# Patient Record
Sex: Male | Born: 1935 | Race: White | Hispanic: No | Marital: Married | Smoking: Former smoker
Health system: Southern US, Community
[De-identification: ages and names within clinical notes are randomized; demographics above are authoritative.]

## PROBLEM LIST (undated history)

## (undated) DIAGNOSIS — I10 Essential (primary) hypertension: Secondary | ICD-10-CM

## (undated) DIAGNOSIS — I219 Acute myocardial infarction, unspecified: Secondary | ICD-10-CM

## (undated) DIAGNOSIS — G4733 Obstructive sleep apnea (adult) (pediatric): Secondary | ICD-10-CM

## (undated) DIAGNOSIS — E669 Obesity, unspecified: Secondary | ICD-10-CM

## (undated) DIAGNOSIS — E785 Hyperlipidemia, unspecified: Secondary | ICD-10-CM

## (undated) DIAGNOSIS — R05 Cough: Secondary | ICD-10-CM

## (undated) DIAGNOSIS — Z955 Presence of coronary angioplasty implant and graft: Secondary | ICD-10-CM

## (undated) DIAGNOSIS — R059 Cough, unspecified: Secondary | ICD-10-CM

## (undated) HISTORY — DX: Cough: R05

## (undated) HISTORY — PX: CARDIAC CATHETERIZATION: SHX172

## (undated) HISTORY — PX: CATARACT EXTRACTION: SUR2

## (undated) HISTORY — PX: CORONARY ARTERY BYPASS GRAFT: SHX141

## (undated) HISTORY — DX: Obesity, unspecified: E66.9

## (undated) HISTORY — DX: Presence of coronary angioplasty implant and graft: Z95.5

## (undated) HISTORY — DX: Acute myocardial infarction, unspecified: I21.9

## (undated) HISTORY — PX: OTHER SURGICAL HISTORY: SHX169

## (undated) HISTORY — DX: Hyperlipidemia, unspecified: E78.5

## (undated) HISTORY — DX: Essential (primary) hypertension: I10

## (undated) HISTORY — DX: Cough, unspecified: R05.9

## (undated) HISTORY — DX: Obstructive sleep apnea (adult) (pediatric): G47.33

---

## 1995-07-13 DIAGNOSIS — I219 Acute myocardial infarction, unspecified: Secondary | ICD-10-CM

## 1995-07-13 DIAGNOSIS — Z955 Presence of coronary angioplasty implant and graft: Secondary | ICD-10-CM

## 1995-07-13 HISTORY — DX: Acute myocardial infarction, unspecified: I21.9

## 1995-07-13 HISTORY — DX: Presence of coronary angioplasty implant and graft: Z95.5

## 2008-07-19 ENCOUNTER — Encounter: Admission: RE | Admit: 2008-07-19 | Discharge: 2008-07-19 | Payer: Self-pay | Admitting: Family Medicine

## 2008-09-16 ENCOUNTER — Encounter: Admission: RE | Admit: 2008-09-16 | Discharge: 2008-09-16 | Payer: Self-pay | Admitting: Family Medicine

## 2009-10-07 ENCOUNTER — Encounter: Admission: RE | Admit: 2009-10-07 | Discharge: 2009-10-07 | Payer: Self-pay | Admitting: Family Medicine

## 2011-05-11 ENCOUNTER — Inpatient Hospital Stay (HOSPITAL_COMMUNITY)
Admission: RE | Admit: 2011-05-11 | Discharge: 2011-05-12 | DRG: 287 | Disposition: A | Discharge status: Home / Self Care | Payer: Medicare Other | Source: Ambulatory Visit | Attending: Cardiology | Admitting: Cardiology

## 2011-05-11 ENCOUNTER — Inpatient Hospital Stay (HOSPITAL_COMMUNITY): Payer: Medicare Other

## 2011-05-11 DIAGNOSIS — I2510 Atherosclerotic heart disease of native coronary artery without angina pectoris: Principal | ICD-10-CM | POA: Diagnosis present

## 2011-05-11 DIAGNOSIS — I252 Old myocardial infarction: Secondary | ICD-10-CM

## 2011-05-11 DIAGNOSIS — E669 Obesity, unspecified: Secondary | ICD-10-CM | POA: Diagnosis present

## 2011-05-11 DIAGNOSIS — E785 Hyperlipidemia, unspecified: Secondary | ICD-10-CM | POA: Diagnosis present

## 2011-05-11 DIAGNOSIS — Z7982 Long term (current) use of aspirin: Secondary | ICD-10-CM

## 2011-05-11 DIAGNOSIS — G4733 Obstructive sleep apnea (adult) (pediatric): Secondary | ICD-10-CM | POA: Diagnosis present

## 2011-05-11 DIAGNOSIS — E119 Type 2 diabetes mellitus without complications: Secondary | ICD-10-CM | POA: Diagnosis present

## 2011-05-11 DIAGNOSIS — I200 Unstable angina: Secondary | ICD-10-CM | POA: Diagnosis present

## 2011-05-11 DIAGNOSIS — Z9861 Coronary angioplasty status: Secondary | ICD-10-CM

## 2011-05-11 DIAGNOSIS — Z87891 Personal history of nicotine dependence: Secondary | ICD-10-CM

## 2011-05-11 DIAGNOSIS — J4489 Other specified chronic obstructive pulmonary disease: Secondary | ICD-10-CM | POA: Diagnosis present

## 2011-05-11 DIAGNOSIS — J449 Chronic obstructive pulmonary disease, unspecified: Secondary | ICD-10-CM | POA: Diagnosis present

## 2011-05-11 DIAGNOSIS — Z79899 Other long term (current) drug therapy: Secondary | ICD-10-CM

## 2011-05-11 LAB — HEPARIN LEVEL (UNFRACTIONATED): Heparin Unfractionated: <0.1 IU/mL — ABNORMAL LOW (ref 0.30–0.70)

## 2011-05-11 LAB — GLUCOSE, CAPILLARY

## 2011-05-11 LAB — BLOOD GAS, ARTERIAL
Drawn by: 23337
O2 Saturation: 95 %
pCO2 arterial: 39.7 mmHg (ref 35.0–45.0)
pO2, Arterial: 73.8 mmHg — ABNORMAL LOW (ref 80.0–100.0)

## 2011-05-11 LAB — COMPREHENSIVE METABOLIC PANEL
Alkaline Phosphatase: 62 U/L (ref 39–117)
BUN: 20 mg/dL (ref 6–23)
Chloride: 102 mEq/L (ref 96–112)
GFR calc Af Amer: >90 mL/min (ref >90)
Sodium: 141 mEq/L (ref 135–145)
Total Protein: 7.2 g/dL (ref 6.0–8.3)

## 2011-05-11 LAB — URINALYSIS, ROUTINE W REFLEX MICROSCOPIC
Leukocytes, UA: NEGATIVE
Protein, ur: NEGATIVE mg/dL
Urobilinogen, UA: 0.2 mg/dL (ref 0.0–1.0)
pH: 5 (ref 5.0–8.0)

## 2011-05-11 LAB — HEMOGLOBIN A1C
Hgb A1c MFr Bld: 6.5 % — ABNORMAL HIGH (ref <5.7)
Mean Plasma Glucose: 140 mg/dL — ABNORMAL HIGH (ref <117)

## 2011-05-11 LAB — CBC
MCH: 28.8 pg (ref 26.0–34.0)
MCHC: 33.3 g/dL (ref 30.0–36.0)
Platelets: 224 10*3/uL (ref 150–400)

## 2011-05-11 LAB — APTT: aPTT: 34 seconds (ref 24–37)

## 2011-05-12 ENCOUNTER — Inpatient Hospital Stay (HOSPITAL_COMMUNITY): Payer: Medicare Other

## 2011-05-12 DIAGNOSIS — I2510 Atherosclerotic heart disease of native coronary artery without angina pectoris: Secondary | ICD-10-CM

## 2011-05-12 DIAGNOSIS — Z01810 Encounter for preprocedural cardiovascular examination: Secondary | ICD-10-CM

## 2011-05-12 LAB — GLUCOSE, CAPILLARY: Glucose-Capillary: 120 mg/dL — ABNORMAL HIGH (ref 70–99)

## 2011-05-12 LAB — SURGICAL PCR SCREEN
MRSA, PCR: NEGATIVE
Staphylococcus aureus: POSITIVE — AB

## 2011-05-12 LAB — BASIC METABOLIC PANEL
BUN: 18 mg/dL (ref 6–23)
CO2: 28 mEq/L (ref 19–32)
Creatinine, Ser: 0.9 mg/dL (ref 0.50–1.35)
GFR calc Af Amer: >90 mL/min (ref >90)

## 2011-05-12 LAB — TYPE AND SCREEN: Antibody Screen: NEGATIVE

## 2011-05-12 LAB — CBC
HCT: 43.1 % (ref 39.0–52.0)
Hemoglobin: 14.5 g/dL (ref 13.0–17.0)
WBC: 7.3 10*3/uL (ref 4.0–10.5)

## 2011-05-13 HISTORY — PX: OTHER SURGICAL HISTORY: SHX169

## 2011-05-13 NOTE — Consult Note (Signed)
NAME:  Victor Morgan, Victor Morgan NO.:  [id number]  MEDICAL RECORD NO.:  [id number]  LOCATION:  3707                         FACILITY:  [facility]  PHYSICIAN:  Salvatore Decent. Dorris Fetch, M.D.DATE OF BIRTH:  11-05-1932  DATE OF CONSULTATION:  05/11/2011 DATE OF DISCHARGE:                                CONSULTATION   REASON FOR CONSULTATION:  Severe two-vessel coronary artery disease with exertional angina.  HISTORY OF PRESENT ILLNESS:  Victor Morgan is a 77 year old gentleman with a history of coronary artery disease who presents with 6 weeks history of exertional anginal symptoms.  He describes this as a tightness in his chest.  He gets it when walking up and incline.  He really does not have much in the way of symptoms when walking on level ground.  It is relieved by rest.  He has not had any rest or nocturnal symptoms.  He has cut his walking back from 2-3 miles per day to about a half a mile a day.  Given his history of coronary artery disease and symptoms, the patient was advised to undergo cardiac catheterization, which was done today.  It showed severe 2 vessel coronary artery disease with a totally occluded LAD, heavily diseased first diagonal.  There was a small ramus intermedius with an 80% stenosis.  His right coronary had a 90% proximal stenosis, and there was a tight stenosis in an acute marginal.  He did have evidence of collateralization to the LAD, but the LAD proper was never visualized on the films.  Ejection fraction was approximately 50%. There was anterior wall motion.  PAST MEDICAL HISTORY:  Significant for coronary artery disease, previous MI, PTA and stent x2 to the LAD in 1997.  A separate procedure of a stent to the circumflex in September 1997, COPD, type 2 non-insulin- dependent diabetes, dyslipidemia, obstructive sleep apnea, and mild obesity.  CURRENT MEDICATIONS ARE: 1. Asmanex 2 puffs at bedtime. 2. CPAP nightly. 3. Combivent 2 puffs q.i.d.  p.r.n., which he seldom uses. 4. Foradil 1 tablet daily. 5. Gemfibrozil 600 mg b.i.d. 6. Hydrochlorothiazide 25 mg daily. 7. Lopressor 75 mg b.i.d. 8. Metformin 1000 mg b.i.d. 9. Omeprazole 20 mg daily. 10.Prednisone Forte 1% eye drops, left eye, 1 drop every morning. 11.Travatan 0.004% 1 drop left eye at bedtime. 12.Aspirin 81 mg daily. 13.Fish oil 1000 mg b.i.d. 14.Vitamin E and vitamin B12.  ALLERGIES:  He has no known drug allergies.  FAMILY HISTORY:  Negative for significant coronary artery disease.  SOCIAL HISTORY:  He was previously in [employer].  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  He remains fairly active and walks on daily basis.  He did smoke in the past.  He quit 15 years ago.  REVIEW OF SYSTEMS:  Denies claudication, peripheral edema.  Does wear glasses.  No paroxysmal nocturnal dyspnea, orthopnea or peripheral edema.  No history of excessive bleeding or bruising.  Does have a ventral hernia.  No stroke or TIA symptoms.  All other systems are negative.  PHYSICAL EXAMINATION:  GENERAL:  Victor Morgan is a slightly overweight but otherwise well-appearing 77 year old gentleman in no acute distress. Neurologically, he is alert and oriented  x3 with no focal deficits. HEENT:  Unremarkable.  He is wearing glasses. NECK:  Supple without thyromegaly, adenopathy, or bruits. CARDIAC:  Regular rate and rhythm.  Normal S1, S2.  No murmurs, rubs, or gallops. LUNGS:  Clear with equal breath sounds bilaterally. ABDOMEN:  Soft, nontender. EXTREMITIES:  Without clubbing, cyanosis, or edema.  He has 2+ pulses throughout.  LABORATORY DATA:  Cardiac catheterization as noted.  A stress Lexiscan on April 29, 2011, showed a moderate-to-severe perfusion defect in the mid anterior, mid anterior septal, apical anterior, apical septal, apical and mid inferior walls.  Post stress, the ejection fraction was 49%.  His white count was 7.2, hematocrit 45,  platelets 188.  Sodium 143, potassium 4.3, BUN 27, creatinine 1.39.  LFTs within normal limits. Albumin 4.8.  Cholesterol 161, HDL was 30, LDL was 100.  IMPRESSION:  Victor Morgan is a 77 year old gentleman with multiple cardiac risk factors and known coronary artery disease who presents with exertional angina and catheterization.  He has severe two-vessel coronary artery disease.  His left circumflex relatively spares the first small obtuse marginal and relatively well-preserved ejection fraction of 50%.  His left anterior descending is totally occluded beyond the point where it had 2 stents placed.  In my opinion, coronary artery bypass grafting is the best option for complete revascularization, and he is a good candidate for that procedure, although the left anterior descending never clearly shows up on the catheterization.  There is evidence of collaterals to it and with wall motion in the anterior wall, there should be a graftable vessel in that location.  I discussed in detail with the patient and his family in general __________ coronary artery bypass grafting, need for general anesthesia, incisions to be used, expected hospital stay, and overall recovery.  I did discuss with them there is an excellent chance of procedural success, however, he does remain at risk for future cardiac events potentially either due to disease within the grafts or progression of his native coronary disease.  They do understand that coronary bypass grafting has been associated with survival benefit in the setting of severe proximal LAD disease.  I discussed with them the indications, risks, benefits, and alternative treatments including percutaneous angioplasty and medical therapy.  They understand the risks of surgery include but not limited to death, 1% stroke, 1-2% myocardial infarction, deep vein thrombosis, pulmonary emboli, bleeding, possible need for transfusions, infections, as well as other  organ system dysfunction including respiratory, renal, or GI complications.  He is at somewhat elevated risk for respiratory complications given his history of chronic obstructive pulmonary disease and need for CPAP.  Victor Morgan understands and accepts these risks.  He understands that there is an excellent chance of procedural success, he accepts the risks of surgery and agrees to proceed.  We will plan to do surgery on Friday, May 14, 2011.     Salvatore Decent Dorris Fetch, M.D.     SCH/MEDQ  D:  05/11/2011  T:  05/11/2011  Job:  [id number]  cc:   Gerlene Burdock A. Alanda Amass, M.D.  Electronically Signed by Charlett Lango M.D. on 05/13/2011 11:43:15 AM

## 2011-05-14 ENCOUNTER — Other Ambulatory Visit: Payer: Self-pay

## 2011-05-14 ENCOUNTER — Inpatient Hospital Stay (HOSPITAL_COMMUNITY)
Admission: RE | Admit: 2011-05-14 | Discharge: 2011-05-19 | DRG: 236 | Disposition: A | Discharge status: Home / Self Care | Payer: Medicare Other | Source: Ambulatory Visit | Attending: Thoracic Surgery (Cardiothoracic Vascular Surgery) | Admitting: Thoracic Surgery (Cardiothoracic Vascular Surgery)

## 2011-05-14 ENCOUNTER — Inpatient Hospital Stay (HOSPITAL_COMMUNITY): Payer: Medicare Other

## 2011-05-14 DIAGNOSIS — I252 Old myocardial infarction: Secondary | ICD-10-CM

## 2011-05-14 DIAGNOSIS — J449 Chronic obstructive pulmonary disease, unspecified: Secondary | ICD-10-CM | POA: Diagnosis present

## 2011-05-14 DIAGNOSIS — J4489 Other specified chronic obstructive pulmonary disease: Secondary | ICD-10-CM | POA: Diagnosis present

## 2011-05-14 DIAGNOSIS — I2510 Atherosclerotic heart disease of native coronary artery without angina pectoris: Principal | ICD-10-CM | POA: Diagnosis present

## 2011-05-14 DIAGNOSIS — I10 Essential (primary) hypertension: Secondary | ICD-10-CM | POA: Diagnosis present

## 2011-05-14 DIAGNOSIS — E119 Type 2 diabetes mellitus without complications: Secondary | ICD-10-CM | POA: Diagnosis present

## 2011-05-14 DIAGNOSIS — I209 Angina pectoris, unspecified: Secondary | ICD-10-CM | POA: Diagnosis present

## 2011-05-14 DIAGNOSIS — G4733 Obstructive sleep apnea (adult) (pediatric): Secondary | ICD-10-CM | POA: Diagnosis present

## 2011-05-14 LAB — POCT I-STAT 3, ART BLOOD GAS (G3+)
Bicarbonate: 26.1 mEq/L — ABNORMAL HIGH (ref 20.0–24.0)
Bicarbonate: 23.1 mEq/L (ref 20.0–24.0)
Bicarbonate: 22.3 mEq/L (ref 20.0–24.0)
O2 Saturation: 97 %
Patient temperature: 36
TCO2: 27 mmol/L (ref 0–100)
TCO2: 24 mmol/L (ref 0–100)
TCO2: 24 mmol/L (ref 0–100)
pCO2 arterial: 47.3 mmHg — ABNORMAL HIGH (ref 35.0–45.0)
pCO2 arterial: 41.9 mmHg (ref 35.0–45.0)
pCO2 arterial: 40.8 mmHg (ref 35.0–45.0)
pH, Arterial: 7.344 — ABNORMAL LOW (ref 7.350–7.450)
pO2, Arterial: 84 mmHg (ref 80.0–100.0)
pO2, Arterial: 95 mmHg (ref 80.0–100.0)

## 2011-05-14 LAB — CBC
HCT: 32 % — ABNORMAL LOW (ref 39.0–52.0)
HCT: 33.5 % — ABNORMAL LOW (ref 39.0–52.0)
Hemoglobin: 10.7 g/dL — ABNORMAL LOW (ref 13.0–17.0)
Hemoglobin: 11.3 g/dL — ABNORMAL LOW (ref 13.0–17.0)
MCHC: 33.7 g/dL (ref 30.0–36.0)
RBC: 3.75 MIL/uL — ABNORMAL LOW (ref 4.22–5.81)
RDW: 13.7 % (ref 11.5–15.5)
WBC: 14.5 10*3/uL — ABNORMAL HIGH (ref 4.0–10.5)

## 2011-05-14 LAB — POCT I-STAT 4, (NA,K, GLUC, HGB,HCT)
Glucose, Bld: 143 mg/dL — ABNORMAL HIGH (ref 70–99)
Glucose, Bld: 113 mg/dL — ABNORMAL HIGH (ref 70–99)
Glucose, Bld: 140 mg/dL — ABNORMAL HIGH (ref 70–99)
HCT: 28 % — ABNORMAL LOW (ref 39.0–52.0)
HCT: 29 % — ABNORMAL LOW (ref 39.0–52.0)
HCT: 27 % — ABNORMAL LOW (ref 39.0–52.0)
Hemoglobin: 10.9 g/dL — ABNORMAL LOW (ref 13.0–17.0)
Hemoglobin: 9.9 g/dL — ABNORMAL LOW (ref 13.0–17.0)
Hemoglobin: 11.2 g/dL — ABNORMAL LOW (ref 13.0–17.0)
Potassium: 4.3 mEq/L (ref 3.5–5.1)
Sodium: 137 mEq/L (ref 135–145)
Sodium: 139 mEq/L (ref 135–145)
Sodium: 136 mEq/L (ref 135–145)

## 2011-05-14 LAB — POCT I-STAT GLUCOSE
Glucose, Bld: 120 mg/dL — ABNORMAL HIGH (ref 70–99)
Operator id: 286331

## 2011-05-14 LAB — POCT I-STAT, CHEM 8
Chloride: 106 mEq/L (ref 96–112)
Creatinine, Ser: 1 mg/dL (ref 0.50–1.35)
Glucose, Bld: 134 mg/dL — ABNORMAL HIGH (ref 70–99)
HCT: 33 % — ABNORMAL LOW (ref 39.0–52.0)
Potassium: 4.5 mEq/L (ref 3.5–5.1)

## 2011-05-14 LAB — HEMOGLOBIN AND HEMATOCRIT, BLOOD
HCT: 29.6 % — ABNORMAL LOW (ref 39.0–52.0)
Hemoglobin: 9.9 g/dL — ABNORMAL LOW (ref 13.0–17.0)

## 2011-05-14 LAB — CREATININE, SERUM
GFR calc Af Amer: >90 mL/min (ref >90)
GFR calc non Af Amer: 80 mL/min — ABNORMAL LOW (ref >90)

## 2011-05-14 LAB — APTT: aPTT: 36 seconds (ref 24–37)

## 2011-05-14 LAB — PROTIME-INR: INR: 1.43 (ref 0.00–1.49)

## 2011-05-15 ENCOUNTER — Other Ambulatory Visit: Payer: Self-pay

## 2011-05-15 ENCOUNTER — Inpatient Hospital Stay (HOSPITAL_COMMUNITY): Payer: Medicare Other

## 2011-05-15 LAB — CBC
HCT: 32.1 % — ABNORMAL LOW (ref 39.0–52.0)
Hemoglobin: 10.6 g/dL — ABNORMAL LOW (ref 13.0–17.0)
MCHC: 33 g/dL (ref 30.0–36.0)
MCV: 85.7 fL (ref 78.0–100.0)
Platelets: 159 10*3/uL (ref 150–400)
RBC: 3.49 MIL/uL — ABNORMAL LOW (ref 4.22–5.81)
RBC: 3.69 MIL/uL — ABNORMAL LOW (ref 4.22–5.81)
WBC: 10.8 10*3/uL — ABNORMAL HIGH (ref 4.0–10.5)

## 2011-05-15 LAB — POCT I-STAT, CHEM 8
Chloride: 100 mEq/L (ref 96–112)
Glucose, Bld: 135 mg/dL — ABNORMAL HIGH (ref 70–99)
HCT: 32 % — ABNORMAL LOW (ref 39.0–52.0)
Potassium: 4 mEq/L (ref 3.5–5.1)

## 2011-05-15 LAB — MAGNESIUM
Magnesium: 2.4 mg/dL (ref 1.5–2.5)
Magnesium: 2.1 mg/dL (ref 1.5–2.5)

## 2011-05-15 LAB — BASIC METABOLIC PANEL
CO2: 23 mEq/L (ref 19–32)
Chloride: 104 mEq/L (ref 96–112)
Potassium: 4.1 mEq/L (ref 3.5–5.1)
Sodium: 138 mEq/L (ref 135–145)

## 2011-05-15 LAB — GLUCOSE, CAPILLARY
Glucose-Capillary: 129 mg/dL — ABNORMAL HIGH (ref 70–99)
Glucose-Capillary: 138 mg/dL — ABNORMAL HIGH (ref 70–99)
Glucose-Capillary: 142 mg/dL — ABNORMAL HIGH (ref 70–99)
Glucose-Capillary: 128 mg/dL — ABNORMAL HIGH (ref 70–99)

## 2011-05-15 LAB — CREATININE, SERUM
GFR calc Af Amer: 71 mL/min — ABNORMAL LOW (ref >90)
GFR calc non Af Amer: 61 mL/min — ABNORMAL LOW (ref >90)

## 2011-05-15 MED ORDER — INSULIN ASPART 100 UNIT/ML ~~LOC~~ SOLN
3.0000 [IU] | Freq: Three times a day (TID) | SUBCUTANEOUS | Status: DC
  Administered 2011-05-16 (×3): 3 [IU] via SUBCUTANEOUS

## 2011-05-15 MED ORDER — OMEGA-3-ACID ETHYL ESTERS 1 G PO CAPS
1.0000 g | ORAL_CAPSULE | Freq: Two times a day (BID) | ORAL | Status: DC
  Administered 2011-05-16 – 2011-05-19 (×7): 1 g via ORAL
  Filled 2011-05-15 (×12): qty 1

## 2011-05-15 MED ORDER — ENOXAPARIN SODIUM 40 MG/0.4ML ~~LOC~~ SOLN
40.0000 mg | SUBCUTANEOUS | Status: DC
  Administered 2011-05-16 – 2011-05-18 (×3): 40 mg via SUBCUTANEOUS
  Filled 2011-05-15 (×6): qty 0.4

## 2011-05-15 MED ORDER — DEXTROSE 5 % IV SOLN
1.5000 g | Freq: Once | INTRAVENOUS | Status: DC
  Filled 2011-05-15: qty 1.5

## 2011-05-15 MED ORDER — DOCUSATE SODIUM 100 MG PO CAPS
200.0000 mg | ORAL_CAPSULE | Freq: Every day | ORAL | Status: DC
  Administered 2011-05-16 – 2011-05-19 (×3): 200 mg via ORAL
  Filled 2011-05-15 (×3): qty 2

## 2011-05-15 MED ORDER — ASPIRIN EC 325 MG PO TBEC
325.0000 mg | DELAYED_RELEASE_TABLET | Freq: Every day | ORAL | Status: DC
  Administered 2011-05-16 – 2011-05-19 (×4): 325 mg via ORAL
  Filled 2011-05-15 (×5): qty 1

## 2011-05-15 MED ORDER — METOPROLOL TARTRATE 1 MG/ML IV SOLN: 2.0000 mg | INTRAVENOUS | Status: DC | PRN

## 2011-05-15 MED ORDER — PANTOPRAZOLE SODIUM 40 MG PO TBEC
40.0000 mg | DELAYED_RELEASE_TABLET | Freq: Every day | ORAL | Status: DC
  Administered 2011-05-16 – 2011-05-19 (×4): 40 mg via ORAL
  Filled 2011-05-15 (×3): qty 1

## 2011-05-15 MED ORDER — MUPIROCIN 2 % EX OINT
TOPICAL_OINTMENT | Freq: Two times a day (BID) | CUTANEOUS | Status: DC
  Administered 2011-05-16 – 2011-05-18 (×6): via NASAL
  Filled 2011-05-15 (×9): qty 22

## 2011-05-15 MED ORDER — INSULIN ASPART 100 UNIT/ML ~~LOC~~ SOLN: 0.0000 [IU] | SUBCUTANEOUS | Status: DC

## 2011-05-15 MED ORDER — SODIUM CHLORIDE 0.9 % IJ SOLN
3.0000 mL | Freq: Two times a day (BID) | INTRAMUSCULAR | Status: DC
  Administered 2011-05-16 – 2011-05-18 (×6): 3 mL via INTRAVENOUS
  Filled 2011-05-15 (×4): qty 3

## 2011-05-15 MED ORDER — BISACODYL 5 MG PO TBEC
10.0000 mg | DELAYED_RELEASE_TABLET | Freq: Every day | ORAL | Status: DC
  Administered 2011-05-16 – 2011-05-19 (×3): 10 mg via ORAL

## 2011-05-15 MED ORDER — INSULIN ASPART 100 UNIT/ML ~~LOC~~ SOLN
3.0000 [IU] | Freq: Three times a day (TID) | SUBCUTANEOUS | Status: DC
  Filled 2011-05-15: qty 3

## 2011-05-15 MED ORDER — METOPROLOL TARTRATE 25 MG/10 ML ORAL SUSPENSION
12.5000 mg | Freq: Two times a day (BID) | ORAL | Status: DC
  Administered 2011-05-16: 12.5 mg
  Filled 2011-05-15 (×4): qty 5

## 2011-05-15 MED ORDER — SODIUM CHLORIDE 0.9 % IV SOLN: INTRAVENOUS | Status: DC

## 2011-05-15 MED ORDER — ONDANSETRON HCL 4 MG/2ML IJ SOLN: 4.0000 mg | Freq: Four times a day (QID) | INTRAMUSCULAR | Status: DC | PRN

## 2011-05-15 MED ORDER — INSULIN ASPART 100 UNIT/ML ~~LOC~~ SOLN
0.0000 [IU] | SUBCUTANEOUS | Status: DC
  Filled 2011-05-15: qty 3

## 2011-05-15 MED ORDER — VITAMIN C 500 MG PO TABS
1000.0000 mg | ORAL_TABLET | Freq: Every day | ORAL | Status: DC
  Administered 2011-05-16 – 2011-05-19 (×4): 1000 mg via ORAL
  Filled 2011-05-15 (×5): qty 2

## 2011-05-15 MED ORDER — OXYCODONE HCL 5 MG PO TABS
5.0000 mg | ORAL_TABLET | ORAL | Status: DC | PRN
  Administered 2011-05-16: 5 mg via ORAL
  Administered 2011-05-16 (×2): 10 mg via ORAL
  Administered 2011-05-17: 5 mg via ORAL
  Administered 2011-05-18: 10 mg via ORAL
  Administered 2011-05-19: 5 mg via ORAL
  Filled 2011-05-15 (×2): qty 2

## 2011-05-15 MED ORDER — MIDAZOLAM HCL 2 MG/2ML IJ SOLN: 2.0000 mg | INTRAMUSCULAR | Status: DC | PRN

## 2011-05-15 MED ORDER — MORPHINE SULFATE 2 MG/ML IJ SOLN: 2.0000 mg | INTRAMUSCULAR | Status: DC | PRN

## 2011-05-15 MED ORDER — PREDNISOLONE ACETATE 1 % OP SUSP
1.0000 [drp] | Freq: Every day | OPHTHALMIC | Status: DC
  Administered 2011-05-16 – 2011-05-19 (×4): 1 [drp] via OPHTHALMIC
  Filled 2011-05-15 (×7): qty 0.1

## 2011-05-15 MED ORDER — INSULIN GLARGINE 100 UNIT/ML ~~LOC~~ SOLN
30.0000 [IU] | Freq: Every day | SUBCUTANEOUS | Status: DC
  Administered 2011-05-16 – 2011-05-17 (×2): 30 [IU] via SUBCUTANEOUS

## 2011-05-15 MED ORDER — CHLORHEXIDINE GLUCONATE CLOTH 2 % EX PADS
6.0000 | MEDICATED_PAD | Freq: Every day | CUTANEOUS | Status: DC
  Filled 2011-05-15 (×4): qty 6

## 2011-05-15 MED ORDER — TRAVOPROST (BAK FREE) 0.004 % OP SOLN
1.0000 [drp] | Freq: Every day | OPHTHALMIC | Status: DC
  Administered 2011-05-16 – 2011-05-18 (×3): 1 [drp] via OPHTHALMIC
  Filled 2011-05-15 (×7): qty 0.1

## 2011-05-15 MED ORDER — ROSUVASTATIN CALCIUM 20 MG PO TABS
20.0000 mg | ORAL_TABLET | Freq: Every day | ORAL | Status: DC
  Administered 2011-05-16 – 2011-05-18 (×3): 20 mg via ORAL
  Filled 2011-05-15 (×5): qty 1

## 2011-05-15 MED ORDER — BISACODYL 10 MG RE SUPP: 10.0000 mg | Freq: Every day | RECTAL | Status: DC | PRN

## 2011-05-15 MED ORDER — LACTATED RINGERS IV SOLN: INTRAVENOUS | Status: DC

## 2011-05-15 MED ORDER — ACETAMINOPHEN 500 MG PO TABS
1000.0000 mg | ORAL_TABLET | Freq: Four times a day (QID) | ORAL | Status: DC
  Administered 2011-05-16 – 2011-05-19 (×13): 1000 mg via ORAL
  Filled 2011-05-15 (×11): qty 2

## 2011-05-16 ENCOUNTER — Inpatient Hospital Stay (HOSPITAL_COMMUNITY): Payer: Medicare Other

## 2011-05-16 LAB — GLUCOSE, CAPILLARY
Glucose-Capillary: 107 mg/dL — ABNORMAL HIGH (ref 70–99)
Glucose-Capillary: 113 mg/dL — ABNORMAL HIGH (ref 70–99)
Glucose-Capillary: 94 mg/dL (ref 70–99)
Glucose-Capillary: 136 mg/dL — ABNORMAL HIGH (ref 70–99)
Glucose-Capillary: 141 mg/dL — ABNORMAL HIGH (ref 70–99)
Glucose-Capillary: 132 mg/dL — ABNORMAL HIGH (ref 70–99)
Glucose-Capillary: 128 mg/dL — ABNORMAL HIGH (ref 70–99)
Glucose-Capillary: 128 mg/dL — ABNORMAL HIGH (ref 70–99)
Glucose-Capillary: 141 mg/dL — ABNORMAL HIGH (ref 70–99)
Glucose-Capillary: 138 mg/dL — ABNORMAL HIGH (ref 70–99)

## 2011-05-16 LAB — BASIC METABOLIC PANEL
BUN: 14 mg/dL (ref 6–23)
CO2: 25 mEq/L (ref 19–32)
Chloride: 99 mEq/L (ref 96–112)
Glucose, Bld: 130 mg/dL — ABNORMAL HIGH (ref 70–99)
Potassium: 3.8 mEq/L (ref 3.5–5.1)

## 2011-05-16 LAB — CBC
HCT: 31.1 % — ABNORMAL LOW (ref 39.0–52.0)
MCH: 29 pg (ref 26.0–34.0)
MCHC: 33.4 g/dL (ref 30.0–36.0)
MCV: 86.6 fL (ref 78.0–100.0)
RDW: 14.2 % (ref 11.5–15.5)

## 2011-05-16 MED ORDER — BISACODYL 10 MG RE SUPP: 10.0000 mg | Freq: Every day | RECTAL | Status: DC | PRN

## 2011-05-16 MED ORDER — ALPRAZOLAM 0.25 MG PO TABS: 0.2500 mg | ORAL_TABLET | Freq: Four times a day (QID) | ORAL | Status: DC | PRN

## 2011-05-16 MED ORDER — SODIUM CHLORIDE 0.9 % IJ SOLN
3.0000 mL | INTRAMUSCULAR | Status: DC | PRN
  Administered 2011-05-19: 3 mL via INTRAVENOUS

## 2011-05-16 MED ORDER — ALUM & MAG HYDROXIDE-SIMETH 400-400-40 MG/5ML PO SUSP
15.0000 mL | ORAL | Status: DC | PRN
  Filled 2011-05-16: qty 30

## 2011-05-16 MED ORDER — SODIUM CHLORIDE 0.9 % IV SOLN: INTRAVENOUS | Status: DC

## 2011-05-16 MED ORDER — ZOLPIDEM TARTRATE 5 MG PO TABS
5.0000 mg | ORAL_TABLET | Freq: Every evening | ORAL | Status: DC | PRN
  Administered 2011-05-18: 5 mg via ORAL
  Filled 2011-05-16: qty 1

## 2011-05-16 MED ORDER — BISACODYL 5 MG PO TBEC
10.0000 mg | DELAYED_RELEASE_TABLET | Freq: Every day | ORAL | Status: DC | PRN
  Filled 2011-05-16 (×2): qty 2

## 2011-05-16 MED ORDER — SODIUM CHLORIDE 0.45 % IV SOLN
INTRAVENOUS | Status: DC
  Administered 2011-05-16: via INTRAVENOUS

## 2011-05-16 MED ORDER — ACETAMINOPHEN 325 MG PO TABS
650.0000 mg | ORAL_TABLET | Freq: Four times a day (QID) | ORAL | Status: DC | PRN
  Filled 2011-05-16: qty 2

## 2011-05-16 MED ORDER — METOPROLOL TARTRATE 25 MG PO TABS
25.0000 mg | ORAL_TABLET | Freq: Two times a day (BID) | ORAL | Status: DC
  Administered 2011-05-16 – 2011-05-17 (×3): 25 mg via ORAL
  Filled 2011-05-16 (×4): qty 1

## 2011-05-16 MED ORDER — MAGNESIUM HYDROXIDE 400 MG/5ML PO SUSP: 30.0000 mL | Freq: Every day | ORAL | Status: DC | PRN

## 2011-05-16 MED ORDER — POVIDONE-IODINE 10 % EX SOLN
1.0000 | Freq: Two times a day (BID) | CUTANEOUS | Status: DC
Start: 2011-05-16 — End: 2011-05-19
  Administered 2011-05-16 – 2011-05-19 (×6): 1 via TOPICAL
  Filled 2011-05-16: qty 15

## 2011-05-16 MED ORDER — FUROSEMIDE 40 MG PO TABS
40.0000 mg | ORAL_TABLET | Freq: Every day | ORAL | Status: DC
  Administered 2011-05-16 – 2011-05-19 (×4): 40 mg via ORAL
  Filled 2011-05-16 (×4): qty 1

## 2011-05-16 MED ORDER — TRAMADOL HCL 50 MG PO TABS: 50.0000 mg | ORAL_TABLET | ORAL | Status: DC | PRN

## 2011-05-16 MED ORDER — ONDANSETRON HCL 4 MG PO TABS: 4.0000 mg | ORAL_TABLET | Freq: Four times a day (QID) | ORAL | Status: DC | PRN

## 2011-05-16 MED ORDER — POTASSIUM CHLORIDE CRYS ER 20 MEQ PO TBCR
40.0000 meq | EXTENDED_RELEASE_TABLET | Freq: Every day | ORAL | Status: DC
  Administered 2011-05-16 – 2011-05-19 (×3): 40 meq via ORAL
  Filled 2011-05-16 (×4): qty 2

## 2011-05-16 NOTE — Progress Notes (Signed)
  Subjective: No c/o  Objective: Vital signs in last 24 hours: Temp:  [97.9 F (36.6 C)-98.4 F (36.9 C)] 98.4 F (36.9 C) (11/04 0400) Pulse Rate:  [72-91] 88  (11/04 0700) Cardiac Rhythm:  [-] Normal sinus rhythm (11/04 0700) Resp:  [19-43] 22  (11/04 0700) BP: (114-155)/(44-70) 132/70 mmHg (11/04 0700) SpO2:  [91 %-97 %] 95 % (11/04 0700) Weight:  [224 lb 10.4 oz (101.9 kg)] 224 lb 10.4 oz (101.9 kg) (11/04 0600)  Hemodynamic parameters for last 24 hours:    Intake/Output from previous day: 11/03 0701 - 11/04 0700 In: 1990 [P.O.:1200; I.V.:790] Out: 3300 [Urine:3175] Intake/Output this shift:    General appearance: alert Neurologic: intact Heart: regular rate and rhythm and no rub Lungs: diminished breath sounds bibasilar Abdomen: soft, non-tender; bowel sounds normal; no masses,  no organomegaly Wound: intact  Lab Results:  Basename 05/16/11 0400 05/15/11 1740 05/15/11 1735  WBC 14.8* -- 14.5*  HGB 10.4* 10.9* --  HCT 31.1* 32.0* --  PLT 157 -- 170   BMET:  Basename 05/16/11 0400 05/15/11 1740 05/15/11 0420  NA 136 138 --  K 3.8 4.0 --  CL 99 100 --  CO2 25 -- 23  GLUCOSE 130* 135* --  BUN 14 17 --  CREATININE 0.97 1.30 --  CALCIUM 8.8 -- 8.5    PT/INR:  Basename 05/14/11 1200  LABPROT 17.7*  INR 1.43   ABG    Component Value Date/Time   PHART 7.348* 05/14/2011 1720   HCO3 22.3 05/14/2011 1720   TCO2 24 05/15/2011 1740   ACIDBASEDEF 3.0* 05/14/2011 1720   O2SAT 97.0 05/14/2011 1720   CBG (last 3)   Basename 05/16/11 0712 05/15/11 2313 05/15/11 2012  GLUCAP 133* 141* 157*    Assessment/Plan: S/P   Mobilize Diuresis Plan for transfer to step-down: see transfer orders   LOS: 2 days    Victor Morgan C 05/16/2011

## 2011-05-16 NOTE — Op Note (Signed)
NAME:  Victor Morgan, HEART NO.:  [id number]  MEDICAL RECORD NO.:  [id number]  LOCATION:  2305                         FACILITY:  [facility]  PHYSICIAN:  Salvatore Decent. Dorris Fetch, M.D.DATE OF BIRTH:  [birth year]  DATE OF PROCEDURE:  05/14/2011 DATE OF DISCHARGE:                              OPERATIVE REPORT   PREOPERATIVE DIAGNOSIS:  Severe three-vessel coronary artery disease with new-onset exertional angina.  POSTOPERATIVE DIAGNOSIS:  Severe three-vessel coronary artery disease with new-onset exertional angina.  PROCEDURE:  Median sternotomy, extracorporeal circulation, coronary artery bypass grafting x5 (left internal mammary artery to left anterior descending, saphenous vein graft to first diagonal, saphenous vein graft to obtuse marginal 2, sequential saphenous vein graft to acute marginal and posterior descending), endoscopic vein harvest, right leg and left thigh.  SURGEON:  Salvatore Decent. Dorris Fetch, MD  ASSISTANT:  Doree Fudge, PA  ANESTHESIA:  General.  FINDINGS:  Saphenous vein from both legs with multiple branching points, vein fair quality but satisfactory, mammary good quality, LAD fair quality target 1 mm, remaining targets good quality.  CLINICAL NOTE:  Victor Morgan is a 77 year old gentleman with previous history of coronary artery disease.  He presents with recent onset angina.  At cardiac catheterization, he was found to have severe three- vessel coronary artery disease and was referred for coronary artery bypass grafting.  The indications, risks, benefits, and alternatives were discussed in detail with the patient.  He understood and accepted the risks and agreed to proceed.  OPERATIVE NOTE:  Victor Morgan was brought to the preoperative holding area on May 14, 2011.  There, the Anesthesia Service placed a Swan-Ganz catheter and an arterial blood pressure monitoring line.  Intravenous antibiotics were administered.  He was taken to the  operating room, anesthetized and intubated.  A Foley catheter was placed.  The chest, abdomen, and the legs were prepped and draped in usual sterile fashion. An incision was made in the medial aspect of the right leg at the level of the knee.  The greater saphenous vein was harvested endoscopically from groin to mid calf.  Simultaneously, a median sternotomy was performed and the left internal mammary artery was harvested using standard technique.  Heparin 2000 units was administered during the vessel harvest.  After harvesting the saphenous vein, it was noted that there were multiple branching points and significant segment of the vein was too small to use as a graft.  Therefore, additional vein was harvested from the left thigh.  They did turn out to be sufficient satisfactory vein to proceed.  The remainder of the full heparin dose was given.  The pericardium was opened after confirming adequate anticoagulation with ACT measurement.  The aorta was cannulated via concentric 2-0 Ethibond pledgeted pursestring sutures.  A dual-stage venous cannula was placed via pursestring suture in the right atrial appendage. Cardiopulmonary bypass was instituted and the patient was cooled to 32 degrees Celsius.  The coronary arteries were inspected and the anastomotic sites were chosen.  The conduits were inspected and cut to length.  A foam pad was placed in the pericardium to protect the left phrenic nerve and insulate the heart.  A temperature probe was placed in the myocardial septum and  a cardioplegia cannula was placed in the ascending aorta.  The aorta was crossclamped.  The left ventricle was emptied via the aortic root vent.  Cardiac arrest then was achieved with a combination of cold antegrade blood cardioplegia and topical iced saline. Cardioplegia 1 L was administered.  The myocardial septal temperature cooled to 15 degrees Celsius.  There was a rapid diastolic arrest. Following, distal  anastomoses were performed.  First, a reversed saphenous vein graft was placed end-to-side to the first diagonal.  The first diagonal was a 1.5-mm vessel at the site of the anastomosis.  There was some plaquing distally but a probe did pass distally past this.  The vein graft was anastomosed end-to-side with a running 7-0 Prolene suture.  At completion of each vein graft, the anastomosis was probed proximally and distally to ensure patency. Cardioplegia was then administered to assess flow and hemostasis. Cardioplegia 500 mL was administered down the vein graft.  As cardioplegia was being administered via the diagonal graft, a reversed saphenous vein graft was placed sequentially to the acute marginal and posterior descending branch of the right coronary.  The acute marginal was a 2-mm good quality target.  Side-to-side anastomosis was performed to this vessel off the side branch of the vein graft.  An end-to-side then was performed to the posterior descending which was approximately 1.8 mm diameter vessel also of good quality.  Both anastomoses were performed with running 7-0 Prolene sutures.  There was excellent flow through the graft.  Additional cardioplegia was administered.  There was good hemostasis as well.  Next, a reverse saphenous vein graft was placed end-to-side to obtuse marginal 2, OM 1 was too small to graft, OM2 was a 2-mm good quality target.  At the site of the anastomosis, the vein graft was anastomosed end-to-side with a running 7 Prolene suture.  Again, there was excellent flow through this graft and good hemostasis with cardioplegia administration.  Next, the left internal mammary artery was brought through a window in the pericardium.  The distal end was beveled.  It was then anastomosed end-to-side to the distal LAD.  The LAD was a diffusely diseased vessel and was approximately 1 mm in diameter, was satisfactory for grafting. The mammary was a 1.5-mm vessel.   An end-to-side anastomosis was performed with a running 8-0 Prolene suture.  At the completion of the anastomosis, the bulldog clamp was briefly removed to inspect for hemostasis.  Septal rewarming was noted.  The bulldog clamp was replaced and the mammary pedicle was tacked to the epicardial surface of the heart with 6-0 Prolene sutures.  Additional cardioplegia was administered.  The vein grafts were cut to length.  The cardioplegia cannula was removed from the ascending aorta and the proximal vein graft anastomoses were performed to 4.5 mm punch aortotomies with running 6-0 Prolene sutures.  At the completion of the final proximal anastomosis, the patient was placed in Trendelenburg position.  Lidocaine was administered.  The aortic root was de-aired, and the bulldog clamps were again removed from the left mammary artery. After de-airing the aortic root,  aortic crossclamp was removed.  The total crossclamp time was 71 minutes.  Rewarming had been initiated at the time of the mammary anastomosis, and while rewarming was completed, all proximal and distal anastomoses were inspected for hemostasis and epicardial pacing wires were placed on the right ventricle and right atrium.  The patient was bradycardic and DDD pacing was initiated at 80 beats per minute.  When the patient had  rewarmed to a core temperature of 37 degrees Celsius, he weaned from cardiopulmonary bypass on the first attempt.  The total bypass time was 95 minutes.  The initial cardiac index was greater than 2 L/min/m2.  The patient remained hemodynamically stable throughout the post bypass period.  Test dose protamine was administered and was well tolerated.  The atrial and aortic cannulae were removed.  The remainder of the protamine was administered without incident.  The chest was irrigated with warm saline.  Hemostasis was achieved.  The pericardium was reapproximated with interrupted 3-0 silk sutures.  A left  pleural and single mediastinal chest tubes were placed through separate subcostal incisions.  The sternum was closed with a combination of single and double heavy gauge stainless steel wires.  The pectoralis fascia, subcutaneous tissue, and skin were closed in standard fashion.  All sponge, needle, and instrument counts were correct at the end of the procedure.  The patient was taken from the operating room to the surgical intensive care unit in good condition.     Salvatore Decent Dorris Fetch, M.D.     SCH/MEDQ  D:  05/14/2011  T:  05/15/2011  Job:  [id number]  cc:   Thereasa Solo. Little, M.D. Richard A. Alanda Amass, M.D.  Electronically Signed by Charlett Lango M.D. on 05/16/2011 11:27:00 AM

## 2011-05-17 LAB — CBC
MCH: 27.7 pg (ref 26.0–34.0)
MCHC: 31.8 g/dL (ref 30.0–36.0)
Platelets: 186 10*3/uL (ref 150–400)
RBC: 3.43 MIL/uL — ABNORMAL LOW (ref 4.22–5.81)

## 2011-05-17 LAB — BASIC METABOLIC PANEL
Calcium: 9.1 mg/dL (ref 8.4–10.5)
GFR calc Af Amer: >90 mL/min (ref >90)
GFR calc non Af Amer: 79 mL/min — ABNORMAL LOW (ref >90)
Potassium: 3.5 mEq/L (ref 3.5–5.1)
Sodium: 136 mEq/L (ref 135–145)

## 2011-05-17 LAB — GLUCOSE, CAPILLARY
Glucose-Capillary: 126 mg/dL — ABNORMAL HIGH (ref 70–99)
Glucose-Capillary: 120 mg/dL — ABNORMAL HIGH (ref 70–99)

## 2011-05-17 MED ORDER — METOPROLOL TARTRATE 50 MG PO TABS
50.0000 mg | ORAL_TABLET | Freq: Two times a day (BID) | ORAL | Status: DC
  Administered 2011-05-17 – 2011-05-19 (×4): 50 mg via ORAL
  Filled 2011-05-17 (×6): qty 1

## 2011-05-17 NOTE — Progress Notes (Signed)
Patient s/p CABG x 5 on 05/14/11.  PTA, pt independent, lives with spouse.  Wife to provide 24 hr care at discharge.  Will follow for home needs as pt progresses.  Pt denies any home needs presently.

## 2011-05-17 NOTE — Progress Notes (Signed)
  Subjective: Feels SOB with exertion.  Poor appetite.  Objective: Vital signs in last 24 hours: Temp:  [98.3 F (36.8 C)-98.4 F (36.9 C)] 98.4 F (36.9 C) (11/05 0646) Pulse Rate:  [79-85] 85  (11/05 0646) Cardiac Rhythm:  [-] Normal sinus rhythm (11/05 0844) Resp:  [20-22] 22  (11/05 0646) BP: (121-153)/(68-72) 153/68 mmHg (11/05 0646) SpO2:  [94 %-96 %] 94 % (11/05 0646) Weight:  [220 lb 11.2 oz (100.109 kg)] 220 lb 11.2 oz (100.109 kg) (11/05 0646)  Hemodynamic parameters for last 24 hours:    Intake/Output from previous day: 11/04 0701 - 11/05 0700 In: 2365 [P.O.:2345; I.V.:20] Out: 500 [Urine:500] Intake/Output this shift: Total I/O In: 240 [P.O.:240] Out: 500 [Urine:500]  General appearance: no distress Heart: regular rate and rhythm Lungs: Few crackles in bases bilaterally Wound: Clean, dry, no erythema Extremities mild LE edema  Lab Results:  Basename 05/17/11 0700 05/16/11 0400  WBC 11.6* 14.8*  HGB 9.5* 10.4*  HCT 29.9* 31.1*  PLT 186 157   BMET:  Basename 05/17/11 0700 05/16/11 0400  NA 136 136  K 3.5 3.8  CL 97 99  CO2 29 25  GLUCOSE 126* 130*  BUN 16 14  CREATININE 0.95 0.97  CALCIUM 9.1 8.8    PT/INR: No results found for this basename: LABPROT,INR in the last 72 hours ABG    Component Value Date/Time   PHART 7.348* 05/14/2011 1720   HCO3 22.3 05/14/2011 1720   TCO2 24 05/15/2011 1740   ACIDBASEDEF 3.0* 05/14/2011 1720   O2SAT 97.0 05/14/2011 1720   CBG (last 3)   Basename 05/17/11 1140 05/17/11 0640 05/16/11 2103  GLUCAP 123* 126* 138*    Assessment/Plan: S/PCABG   Mobilize Pulm- still SOB, requiring 1-2 L O2.  Continue pulm toilet/IS Diurese HTN- BPs have been elevated.  WIll increase beta blocker further and monitor   LOS: 3 days    COLLINS,GINA H 05/17/2011

## 2011-05-17 NOTE — Consult Note (Signed)
Tobacco Cessation- Pt  Quit smoking 15 years ago.

## 2011-05-17 NOTE — Progress Notes (Signed)
CARDIAC REHAB PHASE I   PRE:  Rate/Rhythm: 87 SR  BP:  Supine:   Sitting: 98/60  Standing:   SaO2: 93%2l  MODE:  Ambulation: 380 ft   POST:  Rate/Rhythem: 92  BP:  Supine:   Sitting: 130/64  Standing:    SaO2: 86% RA    92% 2L  Victor Morgan [id number]  Assisted times one and used walker to ambulate. Attempted walk on RA, desaturated to 86% O2 reapplied 2L sat increased to 91%. He tolerated walk fair tired and DOE. To chair with call light in reach.

## 2011-05-17 NOTE — Progress Notes (Signed)
UR Completed.  Victor Morgan [name] 05/17/2011  

## 2011-05-18 LAB — GLUCOSE, CAPILLARY
Glucose-Capillary: 87 mg/dL (ref 70–99)
Glucose-Capillary: 127 mg/dL — ABNORMAL HIGH (ref 70–99)

## 2011-05-18 MED ORDER — OXYCODONE HCL 5 MG PO TABS: 5.0000 mg | ORAL_TABLET | ORAL | Status: AC | PRN

## 2011-05-18 MED ORDER — POTASSIUM CHLORIDE CRYS ER 20 MEQ PO TBCR
40.0000 meq | EXTENDED_RELEASE_TABLET | Freq: Once | ORAL | Status: AC
  Administered 2011-05-18: 40 meq via ORAL

## 2011-05-18 MED ORDER — FUROSEMIDE 40 MG PO TABS: 40.0000 mg | ORAL_TABLET | Freq: Every day | ORAL | Status: DC

## 2011-05-18 MED ORDER — GUAIFENESIN ER 600 MG PO TB12
600.0000 mg | ORAL_TABLET | Freq: Two times a day (BID) | ORAL | Status: DC
  Administered 2011-05-18 – 2011-05-19 (×3): 600 mg via ORAL
  Filled 2011-05-18 (×4): qty 1

## 2011-05-18 MED ORDER — POTASSIUM CHLORIDE CRYS ER 20 MEQ PO TBCR: 20.0000 meq | EXTENDED_RELEASE_TABLET | Freq: Every day | ORAL | Status: DC

## 2011-05-18 MED ORDER — METFORMIN HCL 500 MG PO TABS
1000.0000 mg | ORAL_TABLET | Freq: Two times a day (BID) | ORAL | Status: DC
  Administered 2011-05-18 – 2011-05-19 (×3): 1000 mg via ORAL
  Filled 2011-05-18 (×4): qty 2

## 2011-05-18 MED ORDER — GUAIFENESIN ER 600 MG PO TB12: 600.0000 mg | ORAL_TABLET | Freq: Two times a day (BID) | ORAL | Status: DC | PRN

## 2011-05-18 NOTE — Discharge Summary (Addendum)
Physician Discharge Summary  Patient ID: Victor Morgan MRN: [id number] DOB/AGE: [birth year] 77 y.o.  Admit date: 05/14/2011 Discharge date: 05/18/2011  Admission Diagnoses: 1. History of coronary artery disease (status post MI, PT CAA with stent x2 to the LAD in 1997, PTCA with stent to the circumflex 1997 ) 2. History of diabetes mellitus in (non-insulin-dependent) 3.History dyslipidemia. 4.History of OSA. 5.History of COPD. 6.History of tobacco abuse. 7.History of mild obesity.  Discharge Diagnoses:  1. History of coronary artery disease (status post MI, PT CAA with stent x2 to the LAD in 1997, PTCA with stent to the circumflex 1997 ) 2. History of diabetes mellitus in (non-insulin-dependent) 3.History dyslipidemia. 4.History of OSA. 5.History of COPD. 6.History of tobacco abuse. 7.History of mild obesity. 8.ABL anemia.    History of Presenting Illness: This is a 77 year old Caucasian male with the aforementioned past medical history who presented to [facility] for further evaluation and treatment on 05/11/2011, as he had had a six-week history of exertional angina symptoms. His symptoms are described as chest tightness that usually occurred with activity  (i.e. walking up an incline). He has had to cut back his activity from 2-3 miles per day to about one-half mile per day.  His chest tightness was relieved with rest. He apparently did not have any of these chest symptoms when walking on level ground and he has not had any rest or nocturnal symptoms either. He underwent a cardiac catheterization by Dr. Clarene Duke on 05/12/2011. He was found to have severe two-vessel coronary artery disease (a totally occluded LAD, heavily diseased first diagonal, small ramus intermediate with an 80% stenosis, right coronary artery with a 90% proximal stenosis and a tight stenosis and acute marginal), with left ventricular function of 50%. A cardiothoracic consultation was obtained with Dr.  Dorris Fetch for consideration of coronary bypass grafting surgery. Potential risks, consultations, and benefits of the surgery discussed with the patient and he did agree to proceed. It should be noted that the patient was discharged in stable condition on 05/12/2011 and agreed to return to [place] [facility] of 05-14-11 in order to undergo his heart surgery.  Hospital Course: Patient was extubated without difficulty earlier the evening of surgery. He did have a tmax of 100.8 but then remained afebrile and vital signs stable. Swan-Ganz, A-line, chest tubes, and Foley were all removed early in his postoperative course.  Patient was found to have acute blood loss anemia. His H&H was low as 9.5 and 29.9. He did not require postoperative transfusion. He was found to be volume overloaded and diuresed accordingly. Was started on a low-dose beta blocker which was titrated accordingly secondary to hypertension. As previously stated, the patient had a history of diabetes mellitus he was weaned off his insulin drip. Initially, he was continued on insulin regimen; however, as he tolerates his diet better he was restarted on his metformin 1000 mg by mouth 2 times daily. It should be noted that his hemoglobin A1c preoperative was 6.5 indicating good control. He was felt surgically stable for transfer from the intensive care unit to the PCTU for further convalescence 05/16/2011. He continued to progress with cardiac rehab.  He was requiring a couple liters of oxygen via nasal cannula and today (05/18/2011) is still requiring 2 L oxygen via nasal cannula. The going to wean oxygen as tolerates to keep his O2 sat greater than or equal to 90%. He was encouraged to use incentive spirometer. Epicardial pacing wires are going to be removed today. His chest  tube sutures will be removed in the morning.He has been tolerating a diabetic diet has had a bowel movement. Provided he remains afebrile, hemodynamically stable, is weaned off oxygen and  pending around evaluation is surgically stable for discharge 05/19/2011.  Physical Exam: Cardiovascular: RRR, no murmurs, gallops, or rubs.  Pulmonary: Slightly decreased at the bases;no rales, wheezes, or rhonchi.  Abdomen: Soft, non tender, obese, bowel sounds present.  Extremities: Mild bilateral lower extremity edema.  Wounds: Clean and dry. No erythema or signs of infection. Pre op weight:95 kg; Today's weight 103 kg.  Significant Diagnostic Studies: radiology: CXR done 05/16/2011: stable cardiomegaly, some vascular congestion and basilar atx, small bilateral pleural effusions, and no ptx  Procedures:S/P CABG x 5 (LIMA to LAD,SVG to diagonal 1,SVG to OM 1,SVG sequentially to acute marginal and PDA) with EVH of the right thigh and calf and left thigh 05/14/2011.  Discharge Exam: Blood pressure 101/63, pulse 74, temperature 98.8 F (37.1 C), temperature source Oral, resp. rate 19, height 5\' 9"  (1.753 m), weight 227 lb 1.6 oz (103.012 kg), SpO2 93.00%.    Discharge Orders    Future Appointments: Provider: Department: Dept Phone: Center:   06/09/2011 1:45 PM Loreli Slot, MD [facility] [phone] [facility]     Future Orders Please Complete By Expires   DG Chest 2 View  06/09/11 07/17/12   Scheduling Instructions:   Patient to have a PA/LAT CXR done 06/09/2011 at 1 p.m.   Questions: Responses:   Preferred imaging location? External Comment - [address]   Reason for exam: s/p CABG x 5 06/04/2011      Discharge Medications: Current Discharge Medication List    START taking these medications   Details  furosemide (LASIX) 40 MG tablet Take 1 tablet (40 mg total) by mouth daily. Qty: 7 tablet, Refills: 0    guaiFENesin (MUCINEX) 600 MG 12 hr tablet Take 1 tablet (600 mg total) by mouth 2 (two) times daily as needed for congestion.    oxyCODONE (OXY IR/ROXICODONE) 5 MG immediate release tablet Take 1-2 tablets (5-10 mg total) by mouth every 4 (four) hours as needed  (do not use if pt reports codeine or oxycodone allergy). Qty: 40 tablet, Refills: 0    potassium chloride SA (K-DUR,KLOR-CON) 20 MEQ tablet Take 1 tablet (20 mEq total) by mouth daily. Qty: 7 tablet, Refills: 0      CONTINUE these medications which have NOT CHANGED   Details  aspirin EC 325 MG tablet Take 325 mg by mouth daily.      metFORMIN (GLUCOPHAGE) 1000 MG tablet Take 1,000 mg by mouth 2 (two) times daily with a meal.      metoprolol (LOPRESSOR) 50 MG tablet Take 75 mg by mouth 2 (two) times daily.      omega-3 acid ethyl esters (LOVAZA) 1 G capsule Take 1 g by mouth 2 (two) times daily.      prednisoLONE acetate (PRED FORTE) 1 % ophthalmic suspension Place 1 drop into the left eye daily.      rosuvastatin (CRESTOR) 20 MG tablet Take 20 mg by mouth daily.      Travoprost, BAK Free, (TRAVATAMN) 0.004 % SOLN ophthalmic solution Place 1 drop into the left eye at bedtime.      vitamin B-12 (CYANOCOBALAMIN) 1000 MCG tablet Take 1,000 mcg by mouth daily.      vitamin C (ASCORBIC ACID) 500 MG tablet Take 500 mg by mouth 2 (two) times daily.      vitamin  E 400 UNIT capsule Take 400 Units by mouth daily.        STOP taking these medications     hydrochlorothiazide (HYDRODIURIL) 25 MG tablet      isosorbide mononitrate (IMDUR) 30 MG 24 hr tablet      nitroGLYCERIN (NITROSTAT) 0.4 MG SL tablet        Please note that the patient was not discharged on an ACE or ARB as blood pressure was well controlled with a beta blocker, and he has a preserved LV function.  Follow Up Appointments: Follow-up Information    Follow up with Loreli Slot, MD on 06/09/2011. (Appointment time is 1:45 pm; PA/LAT CXR needs to be obtained at 1 pm.)    Contact information:   [address] [place] [address] [phone]       Follow up with Governor Rooks, MD in 2 weeks.   Contact information:   [address]  [place] [address] [phone]          Signed: Ardelle Balls, [place] 05/18/2011, 2:53 PM   Agree with above

## 2011-05-18 NOTE — Cardiovascular Report (Signed)
NAME:  Victor Morgan, Victor Morgan NO.:  [id number]  MEDICAL RECORD NO.:  [id number]  LOCATION:  3707                         FACILITY:  [facility]  PHYSICIAN:  Thereasa Solo. Shalom Mcguiness, M.D. DATE OF BIRTH:  February 10, 1933  DATE OF PROCEDURE:  05/11/2011 DATE OF DISCHARGE:                           CARDIAC CATHETERIZATION   INDICATIONS FOR TEST:  This 77 year old male had 3 stents placed to his LAD in 1997.  He began having some exertional symptoms about 6 months ago, but a substantial acceleration of his symptoms over the last 6 weeks.  A nuclear study shows anterior wall scarring with a 50% ejection fraction and no ischemia.  He is brought to the cath lab to evaluate his coronary anatomy.  PROCEDURE:  After obtaining informed consent, the patient was prepped and draped in the usual sterile fashion exposing the right groin. Following local anesthetic with 1% Xylocaine, the Seldinger technique was employed and a 5-French introducer sheath was placed in the right femoral artery.  Left and right coronary arteriography, ventriculography in the RAO projection was performed.  COMPLICATIONS:  None.  TOTAL CONTRAST:  80 mL.  EQUIPMENT:  5-French Judkins configuration catheters.  RESULTS:  Hemodynamic monitoring:  His central aortic pressure was 136/64.  His left ventricular pressure was 141/7, and there was no significant valve gradient at the time of pullback.  Ventriculography:  Ventriculography in the RAO projection revealed normal LV systolic function.  His anterior wall showed well-preserved systolic function with no gross abnormalities.  The end-diastolic pressure was 19.  Coronary arteriography.  The stents were easily seen on fluoroscopy in the distribution of the LAD.  1. Left main normal and bifurcated. 2. Circumflex.  The circumflex is a large vessel approaching 4 mm that     is actually larger than the left main.  There were areas of 30% to     40% narrowing within the  circumflex itself.  The first OM had     ostial 70% narrowing, but this vessel is only about 1.5 to 2 mm in     diameter.  The second OM which was a large 3.5 to 4 mm vessel     bifurcated and had only minimal irregularities.  The third and     forth OM were small with mild irregularities. 3. LAD.  The LAD appeared to be 100% occluded at the stent.  The stent     started just distal to the takeoff of the first diagonal.  In the     first diagonal itself, was an area of 90% plus narrowing and     proximal to this in the LAD itself was another area of about 70%     narrowing.  Multiple views were taken to make sure that the stent     did not encroach on the ostium of the diagonal and it appears that     it does not and this could be addressed percutaneously.  There were     some faint collaterals from the right coronary artery noted into     the LAD system. 4. Ramus. The ramus was a 0.5 mm vessel with sequential 80% areas     proximally. 5.  Right coronary artery.  There was a 90% focal area of narrowing in     the proximal right coronary artery.  In the midportion, there were     some 30% and 40% areas of minor narrowing.  The PDA and     posterolateral vessels were small, and there were some small     collaterals going to the LAD.  DISCUSSION:  With the anterior wall showing normal LV systolic function, I suspect the collaterals are actually larger than those noted in cath. With the high-grade stenosis in the RCA, the collaterals were probably reduced because of this.  I spoke with Dr. Alanda Amass and Dr. Excell Seltzer, the feeling is that this can be addressed percutaneously both the diagonal and the RCA without significant difficulty; however, if CVTS feels there is a good chance of a graft being placed into the LAD, then surgery may be the better option; however, if there is significant concern, then we will proceed on with percutaneous intervention tomorrow.  In the interim, the patient  has been admitted to the hospital to be started on IV heparin and await evaluation by Surgery.          ______________________________ Thereasa Solo Kaleia Longhi, M.D.     ABL/MEDQ  D:  05/11/2011  T:  05/11/2011  Job:  [id number]  cc:   Gerlene Burdock A. Alanda Amass, M.D.  Electronically Signed by Julieanne Manson M.D. on 05/18/2011 08:09:45 AM

## 2011-05-18 NOTE — Progress Notes (Addendum)
   Subjective: Patient has complaints of  a productive cough and not sleeping well.  Objective: Vital signs in last 24 hours: Patient Vitals for the past 24 hrs:  BP Temp Temp src Pulse Resp SpO2 Weight  05/18/11 0620 125/72 mmHg 97.9 F (36.6 C) Oral 72  20  95 % 227 lb 1.6 oz (103.012 kg)  05/17/11 2100 114/66 mmHg 98 F (36.7 C) Oral 84  20  93 % -       Intake/Output from previous day: 11/05 0701 - 11/06 0700 In: 800 [P.O.:800] Out: 1100 [Urine:1100] Intake/Output this shift:    Physical Exam:  Cardiovascular: RRR, no murmurs, gallops, or rubs. Pulmonary: Slightly decreased at the bases;no rales, wheezes, or rhonchi. Abdomen: Soft, non tender, obese, bowel sounds present. Extremities: Mild bilateral lower extremity edema. Wounds: Clean and dry.  No erythema or signs of infection.  Lab Results: CBC: Basename 05/17/11 0700 05/16/11 0400  WBC 11.6* 14.8*  HGB 9.5* 10.4*  HCT 29.9* 31.1*  PLT 186 157   BMET:  Basename 05/17/11 0700 05/16/11 0400  NA 136 136  K 3.5 3.8  CL 97 99  CO2 29 25  GLUCOSE 126* 130*  BUN 16 14  CREATININE 0.95 0.97  CALCIUM 9.1 8.8    PT/INR: No results found for this basename: LABPROT,INR in the last 72 hours ABG:  INR: Will add last result for INR, ABG once components are confirmed Will add last 4 CBG results once components are confirmed  Assessment/Plan:  1. CV - SR.  Continue with Lopressor 50 bid. 2.  Pulmonary - On 2L O2 via [place] this am.  Wean O2 and encourage incentive spirometer. 3. Volume Overload - Continue with diuresis.  Will need to continue upon discharge as well. 4.  ABL anemia - H/H stable (9.5/29.9 11/5). 5.  DM- Will stop all scheduled insulin and restart Metformin bid. 6.  Remove EPW. 7.  Mucinex for cough. 8.  Possibly discharge later today vs. am.   Ardelle Balls, PA 05/18/2011

## 2011-05-18 NOTE — Progress Notes (Signed)
CARDIAC REHAB PHASE I   PRE:  Rate/Rhythm: 77 SR  BP:  Supine:   Sitting:  Standing:    SaO2: 95 RA  MODE:  Ambulation: 450 ft   POST:  Rate/Rhythem: 75 SR  BP:  Supine:   Sitting: 130/70  Standing:    SaO2: 95 RA  1140 - 1220 PT AMBULATED HALL WITH WALKER AND MINIMAL ASSIST ON RA. TOLERATED WELL. EDUCATION COMPLETED. PERMISSION GIVEN TO REFER TO CARD REHAB PHASE II [place].   Rosalie Doctor

## 2011-05-19 LAB — GLUCOSE, CAPILLARY: Glucose-Capillary: 110 mg/dL — ABNORMAL HIGH (ref 70–99)

## 2011-05-19 MED ORDER — METOPROLOL TARTRATE 50 MG PO TABS: 50.0000 mg | ORAL_TABLET | Freq: Two times a day (BID) | ORAL | Status: DC

## 2011-05-19 NOTE — Progress Notes (Signed)
CARDIAC REHAB PHASE I   PRE:  Rate/Rhythm: 85SR  BP:  Supine:   Sitting: 122/70  Standing:    SaO2: 93%RA  MODE:  Ambulation: 550 ft   POST:  Rate/Rhythem: 94  BP:  Supine:   Sitting: 130/74  Standing:    SaO2: 96%RA  Tolerated walk well. Ready to go home.Just handheld asst. Did not need walker. [id number]  Duanne Limerick

## 2011-05-19 NOTE — Progress Notes (Signed)
   Subjective: Patient eating breakfast without complaints.  He really wants to go home today.  Objective: Vital signs in last 24 hours: Patient Vitals for the past 24 hrs:  BP Temp Temp src Pulse Resp SpO2 Weight  05/19/11 0548 111/67 mmHg 97.5 F (36.4 C) Axillary 77  20  94 % 219 lb 12.8 oz (99.701 kg)  05/18/11 2225 130/80 mmHg 98.5 F (36.9 C) - 79  18  94 % -  05/18/11 2124 123/65 mmHg - - 85  - - -  05/18/11 1900 122/69 mmHg 98.6 F (37 C) Oral 83  20  93 % -  05/18/11 1653 138/68 mmHg 98.1 F (36.7 C) - 72  18  96 % -  05/18/11 1615 106/71 mmHg 98.6 F (37 C) - 73  16  94 % -  05/18/11 1300 101/63 mmHg 98.8 F (37.1 C) Oral 74  19  93 % -       Intake/Output from previous day: 11/06 0701 - 11/07 0700 In: 840 [P.O.:840] Out: 500 [Urine:500]     Physical Exam:  Cardiovascular: RRR, no murmurs, gallops, or rubs. Pulmonary: Right lung clear and decreased at left base;no rales, wheezes, or rhonchi. Abdomen: Soft, non tender, bowel sounds present. Extremities: Trace bilateral lower extremity edema. Wounds: Clean and dry.  No erythema or signs of infection.  Lab Results: CBC: Basename 05/17/11 0700  WBC 11.6*  HGB 9.5*  HCT 29.9*  PLT 186   BMET:  Basename 05/17/11 0700  NA 136  K 3.5  CL 97  CO2 29  GLUCOSE 126*  BUN 16  CREATININE 0.95  CALCIUM 9.1     Assessment/Plan:  1. CV - Had increased heart rate briefly into 140's earlier this am. SR this am. Continue current medications. 2.  Pulmonary - Weaned off O2 11/6. 3. Volume Overload - Continue with diuresis as an outpatient. 4.  ABL anemia - H/H stable. 5.  Remove CT sutures. 6. Will discharge home today.   Ardelle Balls, PA 05/19/2011

## 2011-05-19 NOTE — Progress Notes (Signed)
DC'd CT sutures per MD order per hospital protocol. Applied steri strips and Benzoin to Ct sites. Patient tolerated well. Will continue to monitor closely.

## 2011-05-20 ENCOUNTER — Encounter: Payer: Self-pay | Admitting: Thoracic Surgery (Cardiothoracic Vascular Surgery)

## 2011-05-20 ENCOUNTER — Encounter: Payer: Self-pay | Admitting: *Deleted

## 2011-05-20 ENCOUNTER — Ambulatory Visit (INDEPENDENT_AMBULATORY_CARE_PROVIDER_SITE_OTHER): Payer: Self-pay | Admitting: Thoracic Surgery (Cardiothoracic Vascular Surgery)

## 2011-05-20 VITALS — BP 135/79 | HR 82 | Temp 98.2°F | Resp 16

## 2011-05-20 DIAGNOSIS — E669 Obesity, unspecified: Secondary | ICD-10-CM | POA: Insufficient documentation

## 2011-05-20 DIAGNOSIS — E785 Hyperlipidemia, unspecified: Secondary | ICD-10-CM | POA: Insufficient documentation

## 2011-05-20 DIAGNOSIS — G4733 Obstructive sleep apnea (adult) (pediatric): Secondary | ICD-10-CM | POA: Insufficient documentation

## 2011-05-20 DIAGNOSIS — T8130XA Disruption of wound, unspecified, initial encounter: Secondary | ICD-10-CM

## 2011-05-20 DIAGNOSIS — I219 Acute myocardial infarction, unspecified: Secondary | ICD-10-CM | POA: Insufficient documentation

## 2011-05-20 NOTE — Progress Notes (Signed)
  Victor Morgan is a 77 year old gentleman who had coronary bypass grafting on November 2. He was discharged home a few days ago. This morning he noted some bloody drainage from the inferior aspect of his sternal wound and call the office. He states he actually feels better today than he has since the surgery. He denies any fevers or chills, he has not noted any clicking or popping of the sternum, and his pain in the incision is less than it has been. He did earlier in the day no some drainage in the bottom of his wound with of blood clot, since then there is been a minimal amount of drainage which is reddish in color. Has only changed his dressing one time since the initial output.  BP 135/79  Pulse 82  Temp(Src) 98.2 F (36.8 C) (Oral)  Resp 16  SpO2 92% On exam the sternum is stable at the inferior aspect of the wound there is slight separation of the skin closure with minimal serosanguineous drainage. There is very faint erythema, but no definite infection.  Impression  Drainage from sternal wound likely is fat necrosis, but there is some faint erythema to give concern that this is a possible early superficial infection. I recommended that we treat him with Avelox 400 mg by mouth daily. He is to return to the office on Monday for a wound check. He knows to call if he experiences fevers, chills, sweats, increasing sternal pain, clicking or popping of the sternum or other motion, increasing redness, changing character the drainage.

## 2011-05-24 ENCOUNTER — Encounter: Payer: Self-pay | Admitting: Physician Assistant

## 2011-05-24 ENCOUNTER — Ambulatory Visit (INDEPENDENT_AMBULATORY_CARE_PROVIDER_SITE_OTHER): Payer: Self-pay | Admitting: Physician Assistant

## 2011-05-24 DIAGNOSIS — Z951 Presence of aortocoronary bypass graft: Secondary | ICD-10-CM

## 2011-05-24 DIAGNOSIS — T8130XA Disruption of wound, unspecified, initial encounter: Secondary | ICD-10-CM

## 2011-05-24 NOTE — Progress Notes (Signed)
HPI: Patient returns for follow up for sternal wound check.  He denies fever, chills, pain, clicking or popping of the sternum,or increased drainage. The patient is s/p CABG x 5 on 05/14/2011.    Current Outpatient Prescriptions  Medication Sig Dispense Refill  . aspirin EC 325 MG tablet Take 325 mg by mouth daily.        . furosemide (LASIX) 40 MG tablet Take 1 tablet (40 mg total) by mouth daily.  7 tablet  0  . guaiFENesin (MUCINEX) 600 MG 12 hr tablet Take 1 tablet (600 mg total) by mouth 2 (two) times daily as needed for congestion.      . metFORMIN (GLUCOPHAGE) 1000 MG tablet Take 1,000 mg by mouth 2 (two) times daily with a meal.        . metoprolol (LOPRESSOR) 50 MG tablet Take 1 tablet (50 mg total) by mouth 2 (two) times daily.  60 tablet  1  . moxifloxacin (AVELOX) 400 MG tablet Take 400 mg by mouth daily.        Marland Kitchen omega-3 acid ethyl esters (LOVAZA) 1 G capsule Take 1 g by mouth 2 (two) times daily.        Marland Kitchen oxyCODONE (OXY IR/ROXICODONE) 5 MG immediate release tablet Take 1-2 tablets (5-10 mg total) by mouth every 4 (four) hours as needed (do not use if pt reports codeine or oxycodone allergy).  40 tablet  0  . potassium chloride SA (K-DUR,KLOR-CON) 20 MEQ tablet Take 1 tablet (20 mEq total) by mouth daily.  7 tablet  0  . prednisoLONE acetate (PRED FORTE) 1 % ophthalmic suspension Place 1 drop into the left eye daily.        . rosuvastatin (CRESTOR) 20 MG tablet Take 20 mg by mouth daily.        . Travoprost, BAK Free, (TRAVATAMN) 0.004 % SOLN ophthalmic solution Place 1 drop into the left eye at bedtime.        . vitamin B-12 (CYANOCOBALAMIN) 1000 MCG tablet Take 1,000 mcg by mouth daily.        . vitamin C (ASCORBIC ACID) 500 MG tablet Take 500 mg by mouth 2 (two) times daily.        . vitamin E 400 UNIT capsule Take 400 Units by mouth daily.          Vital Signs:  Temp 99.2,bp 118/64,hr 78,rr 20, O2 sat 94% on room air.  Physical Exam:  Cardiovascular: RRR, no murmurs,  gallops, or rub. Pulmonary: Clear; no rales, wheezes, rhonchi. Extremities:  Bilateral lower extremity edema. Wounds: Sternum is solid. There is slight erythema inferior of the sternal wound. There is a slight clearish drainage; no purulence whatsoever. Remainder of sternal wound clean dry well-healed. Lower extremity wounds are clean dry with eschars. There is slight erythema around the proximal right lower extremity wound. No drainage.   Impression and Plan: I used a Q-tip to probe the depth of the wound. The wound was not very deep; it was very superficial and clear drainage was noted. Again, no purulence. The etiology of this is likely secondary to fat necrosis. Patient was instructed to continue taking his Avelox until it is gone. He is going to return to see Dr. Dorris Fetch in one week for another wound check. He was instructed if he develops any fever, chills, increased redness, clicking or popping of the sternum or increased drainage he is to contact the office in the interim. As stated in the patient's physical exam, he  still continues to have bilateral lower extremity edema (although decreased from previously). He was unsure how much Lasix and potassium he has from discharge. I gave him a prescription for Lasix 40 mg by mouth daily x7 days as well as potassium chloride 20 mg by mouth daily x7 days.

## 2011-06-01 ENCOUNTER — Encounter: Payer: Self-pay | Admitting: Thoracic Surgery (Cardiothoracic Vascular Surgery)

## 2011-06-01 ENCOUNTER — Ambulatory Visit (INDEPENDENT_AMBULATORY_CARE_PROVIDER_SITE_OTHER): Payer: Self-pay | Admitting: Thoracic Surgery (Cardiothoracic Vascular Surgery)

## 2011-06-01 VITALS — BP 146/70 | HR 80 | Resp 20 | Ht 71.0 in | Wt 210.0 lb

## 2011-06-01 DIAGNOSIS — Z951 Presence of aortocoronary bypass graft: Secondary | ICD-10-CM

## 2011-06-01 NOTE — Progress Notes (Signed)
  HPI:  Victor Morgan returns for followup. He had coronary bypass grafting x5 on November 2. He was seen in the office last week because of drainage from his sternal wound. He was started on Avelox at that time. He states that since his last visit the drainage stopped, he has some mild soreness but is only taking pain medication and night before he goes to sleep. He denies fevers or chills.  Current Outpatient Prescriptions  Medication Sig Dispense Refill  . aspirin EC 325 MG tablet Take 325 mg by mouth daily.        Marland Kitchen guaiFENesin (MUCINEX) 600 MG 12 hr tablet Take 1 tablet (600 mg total) by mouth 2 (two) times daily as needed for congestion.      . metFORMIN (GLUCOPHAGE) 1000 MG tablet Take 1,000 mg by mouth 2 (two) times daily with a meal.        . metoprolol (LOPRESSOR) 50 MG tablet Take 1 tablet (50 mg total) by mouth 2 (two) times daily.  60 tablet  1  . prednisoLONE acetate (PRED FORTE) 1 % ophthalmic suspension Place 1 drop into the left eye daily.        . rosuvastatin (CRESTOR) 20 MG tablet Take 20 mg by mouth daily.        . Travoprost, BAK Free, (TRAVATAMN) 0.004 % SOLN ophthalmic solution Place 1 drop into the left eye at bedtime.        . vitamin B-12 (CYANOCOBALAMIN) 1000 MCG tablet Take 1,000 mcg by mouth daily.        . vitamin C (ASCORBIC ACID) 500 MG tablet Take 500 mg by mouth 2 (two) times daily.        . vitamin E 400 UNIT capsule Take 400 Units by mouth daily.          Physical Exam BP 146/70  Pulse 80  Resp 20  Ht 5\' 11"  (1.803 m)  Wt 210 lb (95.255 kg)  BMI 29.29 kg/m2  SpO2 95% Well-appearing 77 year old male in no acute distress Lungs mildly diminished left base otherwise clear Cardiac regular rate and rhythm, no rubs or murmurs Sternum stable Sternal and leg incisions clean, dry and intact, no erythema Extremities trace edema  Diagnostic Tests:  None  Impression:  77 year old gentleman about 3 weeks out from coronary bypass grafting x5. He is doing well  at this point in time, his exercise tolerance is good, he has minimal discomfort. He had some drainage from his sternal wound which is resolved. The wound and sternum appeared to be healing normally with no signs of infection.  We did discuss driving he may do so with the limitations of short, low speed errand-type trips. He should avoid heavy traffic, high speeds and long trips for the next 3-4 weeks. He is still not lift any objects greater than 10 pounds for another 3 weeks.  He has an appointment with Dr. Alanda Amass tomorrow. He will discuss initiating cardiac rehabilitation at that time. I think he should be ready to start any time in the next 2-3 weeks.  Plan:  He has a followup appointment scheduled for November 28, I will see him back at that time. He will have a chest x-ray done at that visit.

## 2011-06-07 ENCOUNTER — Other Ambulatory Visit: Payer: Self-pay | Admitting: Thoracic Surgery (Cardiothoracic Vascular Surgery)

## 2011-06-07 DIAGNOSIS — I2510 Atherosclerotic heart disease of native coronary artery without angina pectoris: Secondary | ICD-10-CM

## 2011-06-09 ENCOUNTER — Ambulatory Visit
Admission: RE | Admit: 2011-06-09 | Discharge: 2011-06-09 | Disposition: A | Discharge status: Home / Self Care | Payer: Medicare Other | Source: Ambulatory Visit | Attending: Thoracic Surgery (Cardiothoracic Vascular Surgery) | Admitting: Thoracic Surgery (Cardiothoracic Vascular Surgery)

## 2011-06-09 ENCOUNTER — Ambulatory Visit (INDEPENDENT_AMBULATORY_CARE_PROVIDER_SITE_OTHER): Payer: Self-pay | Admitting: Thoracic Surgery (Cardiothoracic Vascular Surgery)

## 2011-06-09 ENCOUNTER — Encounter: Payer: Self-pay | Admitting: Thoracic Surgery (Cardiothoracic Vascular Surgery)

## 2011-06-09 VITALS — BP 106/61 | HR 61 | Resp 20 | Ht 71.0 in | Wt 210.0 lb

## 2011-06-09 DIAGNOSIS — I2510 Atherosclerotic heart disease of native coronary artery without angina pectoris: Secondary | ICD-10-CM

## 2011-06-09 DIAGNOSIS — Z951 Presence of aortocoronary bypass graft: Secondary | ICD-10-CM

## 2011-06-09 NOTE — Progress Notes (Signed)
HPI: Victor Morgan returns today postoperative followup. He had coronary bypass grafting x5 on November 2. His initial postoperative course was uncomplicated. He did have some sternal drainage which was likely due to fat necrosis, but he was treated with antibiotics as a precaution. He now returns for a scheduled postoperative followup visit.  He states he has not had any further drainage the sternum, there is no clicking or popping, he denied any anginal-type pain or shortness of breath. He is walking 15 minutes twice daily without difficulty. He has not heard from cardiac rehabilitation yet. He's not using pain medication during the day, but does take one pain pill before goes to sleep at night. 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  Current Outpatient Prescriptions  Medication Sig Dispense Refill  . aspirin EC 325 MG tablet Take 325 mg by mouth daily.        Marland Kitchen guaiFENesin (MUCINEX) 600 MG 12 hr tablet Take 1 tablet (600 mg total) by mouth 2 (two) times daily as needed for congestion.      . hydrochlorothiazide (HYDRODIURIL) 25 MG tablet Take 25 mg by mouth daily.        . metFORMIN (GLUCOPHAGE) 1000 MG tablet Take 1,000 mg by mouth 2 (two) times daily with a meal.        . metoprolol (LOPRESSOR) 50 MG tablet Take 1 tablet (50 mg total) by mouth 2 (two) times daily.  60 tablet  1  . oxyCODONE (OXY IR/ROXICODONE) 5 MG immediate release tablet every 8 (eight) hours as needed.       . prednisoLONE acetate (PRED FORTE) 1 % ophthalmic suspension Place 1 drop into the left eye daily.        . rosuvastatin (CRESTOR) 20 MG tablet Take 20 mg by mouth daily.        . Travoprost, BAK Free, (TRAVATAMN) 0.004 % SOLN ophthalmic solution Place 1 drop into the left eye at bedtime.        . vitamin B-12 (CYANOCOBALAMIN) 1000 MCG tablet Take 1,000 mcg by mouth daily.        . vitamin C (ASCORBIC ACID) 500 MG tablet Take 500 mg by mouth 2 (two) times daily.        . vitamin E 400 UNIT capsule Take 400 Units  by mouth daily.           Physical Exam: BP 106/61  Pulse 61  Resp 20  Ht 5\' 11"  (1.803 m)  Wt 210 lb (95.255 kg)  BMI 29.29 kg/m2  SpO2 97% Gen. well-developed well-nourished 77 year old male in no acute distress Lungs clear with equal breath sounds bilaterally Cardiac regular rate and rhythm normal S1 and S2, no rub Sternum stable Sternal and leg incisions clean dry and intact Trace peripheral edema  Diagnostic Tests: Chest x-ray shows tiny bilateral pleural effusions and a small amount of associated basilar atelectasis  Impression: Victor Morgan is doing well now about 4 weeks out from coronary bypass grafting. He is having minimal discomfort, no angina, and his exercise tolerance is good. I informed him that he should hear from cardiac rehabilitation probably sometime next week, and that if he doesn't he should contact Dr. Kandis Cocking office. He has been driving a limited basis and I recommended he continue with that for the next several weeks. He is not to lift any objects greater than 10 pounds for another 2 weeks.  Plan: He will continue to be followed in the cardiac standpoint by Dr.  Richard Alanda Amass..  I would be happy to see him back any time that I can be of any further assistance with his care

## 2011-06-11 ENCOUNTER — Encounter: Payer: Self-pay | Admitting: *Deleted

## 2011-06-17 ENCOUNTER — Encounter (HOSPITAL_COMMUNITY): Payer: Self-pay | Admitting: Anesthesiology

## 2011-07-01 ENCOUNTER — Ambulatory Visit (HOSPITAL_COMMUNITY): Payer: Medicare Other

## 2011-07-16 ENCOUNTER — Encounter (HOSPITAL_COMMUNITY)
Admission: RE | Admit: 2011-07-16 | Discharge: 2011-07-16 | Disposition: A | Discharge status: Home / Self Care | Payer: Medicare Other | Source: Ambulatory Visit | Attending: Cardiovascular Disease | Admitting: Cardiovascular Disease

## 2011-07-16 DIAGNOSIS — I200 Unstable angina: Secondary | ICD-10-CM | POA: Insufficient documentation

## 2011-07-16 DIAGNOSIS — I2510 Atherosclerotic heart disease of native coronary artery without angina pectoris: Secondary | ICD-10-CM | POA: Insufficient documentation

## 2011-07-16 DIAGNOSIS — E785 Hyperlipidemia, unspecified: Secondary | ICD-10-CM | POA: Insufficient documentation

## 2011-07-16 DIAGNOSIS — Z5189 Encounter for other specified aftercare: Secondary | ICD-10-CM | POA: Insufficient documentation

## 2011-07-16 DIAGNOSIS — E669 Obesity, unspecified: Secondary | ICD-10-CM | POA: Insufficient documentation

## 2011-07-16 DIAGNOSIS — Z7982 Long term (current) use of aspirin: Secondary | ICD-10-CM | POA: Insufficient documentation

## 2011-07-16 DIAGNOSIS — Z87891 Personal history of nicotine dependence: Secondary | ICD-10-CM | POA: Insufficient documentation

## 2011-07-16 DIAGNOSIS — E783 Hyperchylomicronemia: Secondary | ICD-10-CM | POA: Insufficient documentation

## 2011-07-16 DIAGNOSIS — I252 Old myocardial infarction: Secondary | ICD-10-CM | POA: Insufficient documentation

## 2011-07-16 DIAGNOSIS — E119 Type 2 diabetes mellitus without complications: Secondary | ICD-10-CM | POA: Insufficient documentation

## 2011-07-16 DIAGNOSIS — Z9861 Coronary angioplasty status: Secondary | ICD-10-CM | POA: Insufficient documentation

## 2011-07-16 DIAGNOSIS — Z79899 Other long term (current) drug therapy: Secondary | ICD-10-CM | POA: Insufficient documentation

## 2011-07-16 DIAGNOSIS — J4489 Other specified chronic obstructive pulmonary disease: Secondary | ICD-10-CM | POA: Insufficient documentation

## 2011-07-16 DIAGNOSIS — J449 Chronic obstructive pulmonary disease, unspecified: Secondary | ICD-10-CM | POA: Insufficient documentation

## 2011-07-16 LAB — BASIC METABOLIC PANEL
CO2: 26 mEq/L (ref 19–32)
Calcium: 10.3 mg/dL (ref 8.4–10.5)
GFR calc Af Amer: >90 mL/min (ref >90)
Sodium: 140 mEq/L (ref 135–145)

## 2011-07-16 LAB — GLUCOSE, CAPILLARY
Glucose-Capillary: 120 mg/dL — ABNORMAL HIGH (ref 70–99)
Glucose-Capillary: 111 mg/dL — ABNORMAL HIGH (ref 70–99)

## 2011-07-16 NOTE — Progress Notes (Addendum)
Pt started cardiac rehab today.  Pt tolerated light exercise without difficulty. Telemetry rhythm sinus with intermittent PVC's.  Patient asymptomatic.  Faxed ECG tracings to Dr Kandis Cocking  Office for review. Dr Clarene Duke reviewed ECG tracings, ordered BMET. Victor Morgan was taken up to the lab for lab draw with order faxed from MD office. Will continue to monitor the patient throughout  the program. Victor Morgan mentioned having a soreness in both of his shoulders since his cholesterol medication was increased. The patient has noticed this symptoms for the past few days.  Dr Kandis Cocking office called and notified to contact patient about symptoms.

## 2011-07-19 ENCOUNTER — Encounter (HOSPITAL_COMMUNITY)
Admission: RE | Admit: 2011-07-19 | Payer: Medicare Other | Source: Ambulatory Visit | Attending: Cardiology | Admitting: Cardiology

## 2011-07-21 ENCOUNTER — Encounter (HOSPITAL_COMMUNITY)
Admission: RE | Admit: 2011-07-21 | Discharge: 2011-07-21 | Disposition: A | Discharge status: Home / Self Care | Payer: Medicare Other | Source: Ambulatory Visit | Attending: Cardiology | Admitting: Cardiology

## 2011-07-21 LAB — GLUCOSE, CAPILLARY: Glucose-Capillary: 131 mg/dL — ABNORMAL HIGH (ref 70–99)

## 2011-07-21 NOTE — Progress Notes (Signed)
Victor Morgan 78 y.o. male       Nutrition Screen                                                                    YES  NO Do you live in a nursing home?  X   Do you eat out more than 3 times/week?    X If yes, how many times per week do you eat out?  Do you have food allergies?   X If yes, what are you allergic to?  Have you gained or lost more than 10 lbs without trying?               X If yes, how much weight have you lost and over what time period?  lbs gained or lost over  weeks/month  Do you want to lose weight?    X  If yes, what is a goal weight or amount of weight you would like to lose? 10 lb  Do you eat alone most of the time?   X   Do you eat less than 2 meals/day?  X If yes, how many meals do you eat?  Do you drink more than 3 alcohol drinks/day?  X If yes, how many drinks per day?  Are you having trouble with constipation? *  X If yes, what are you doing to help relieve constipation?  Do you have financial difficulties with buying food?*    X   Are you experiencing regular nausea/ vomiting?*     X   Do you have a poor appetite? *                                        X   Do you have trouble chewing/swallowing? *   X    Pt with diagnoses of:  X CABG              X Stent/ PTCA X GERD          X COPD X Dyslipidemia  / HDL< 40 / LDL>70 / High TG      X %  Body fat >goal / Body Mass Index >25 X HTN / BP >120/80 X MI X DM/A1c >6 / CBG >126       Pt Risk Score   1       Diagnosis Risk Score  90       Total Risk Score   91                        X High Risk                Low Risk              HT:  69" Ht Readings from Last 1 Encounters:  06/09/11 5\' 11"  (1.803 m)    WT:   205 lb (93.2 kg) Wt Readings from Last 3 Encounters:  06/09/11 210 lb (95.255 kg)  06/01/11 210 lb (95.255 kg)  05/24/11 210 lb (95.255 kg)     IBW 72.7 128%IBW BMI 30.3 35.1%body fat   Meds  reviewed: Folic Acid, Ferrex, Hydrochlorothiazide, Metformin, Fish Oil, vit B12, vit E  Past  Medical History  Diagnosis Date  . COPD (chronic obstructive pulmonary disease)   . Diabetes mellitus   . Hyperlipidemia   . Sleep apnea, obstructive     CPAP NIGHTLY  . Myocardial infarction 1997  . Hx of heart artery stent 1997    PTA  AND STENT X2 TO LAD,STENT TO CICUMFLEX  . Obesity, mild         Activity level: Pt is active  Wt goal: 181-193 lb ( 82.3-87.7 kg) Current tobacco use? No Food/Drug Interaction? No  Labs:  Lab Results  Component Value Date   HGBA1C 6.5* 05/11/2011   05/17/11 Glucose 126   LDL goal: < 70       MI, DM and > 2:      HTN, > 78 yo male  Estimated Daily Nutrition Needs for: ? wt loss  1500-2000 Kcal , Total Fat 40-55gm, Saturated Fat 11-15 gm, Trans Fat 1.4-2.0 gm,  Sodium less than 1500 mg, Grams of CHO 175-250

## 2011-07-23 ENCOUNTER — Encounter (HOSPITAL_COMMUNITY)
Admission: RE | Admit: 2011-07-23 | Discharge: 2011-07-23 | Disposition: A | Discharge status: Home / Self Care | Payer: Medicare Other | Source: Ambulatory Visit | Attending: Cardiology | Admitting: Cardiology

## 2011-07-26 ENCOUNTER — Encounter (HOSPITAL_COMMUNITY): Payer: Medicare Other

## 2011-07-28 ENCOUNTER — Encounter (HOSPITAL_COMMUNITY)
Admission: RE | Admit: 2011-07-28 | Discharge: 2011-07-28 | Disposition: A | Discharge status: Home / Self Care | Payer: Medicare Other | Source: Ambulatory Visit | Attending: Cardiology | Admitting: Cardiology

## 2011-07-30 ENCOUNTER — Encounter (HOSPITAL_COMMUNITY): Payer: Medicare Other

## 2011-08-02 ENCOUNTER — Encounter (HOSPITAL_COMMUNITY)
Admission: RE | Admit: 2011-08-02 | Discharge: 2011-08-02 | Disposition: A | Discharge status: Home / Self Care | Payer: Medicare Other | Source: Ambulatory Visit | Attending: Cardiovascular Disease | Admitting: Cardiovascular Disease

## 2011-08-02 LAB — GLUCOSE, CAPILLARY: Glucose-Capillary: 153 mg/dL — ABNORMAL HIGH (ref 70–99)

## 2011-08-02 NOTE — Progress Notes (Addendum)
Reviewed home exercise with pt today.  Pt plans to walk and use treadmill at home for exercise.  Reviewed THR, pulse (needs to continue to practice), RPE, sign and symptoms, and when to call 911 or MD.  Pt voiced understanding. Fabio Pierce, MA, ACSM RCEP

## 2011-08-02 NOTE — Progress Notes (Signed)
  Victor Morgan 78 y.o. male Nutrition Note Spoke with pt.  Nutrition Plan and Nutrition Survey reviewed with pt.  This Clinical research associate went over Diabetes Education test results. Last A1c indicates blood glucose well controlled. Pt states he was educated by an RD at the [place] re: DM diet.  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  Pt reports he lost 16 lbs 1 year ago due to DM dx.  According to the pt, his pre-surgical wt was 215 lbs.  Pt wt is down 10 lbs from pre-surgery wt.  Pt wants to "lose 10 more lbs."  Wt loss goal wt discussed.  Nutrition Diagnosis   Food-and nutrition-related knowledge deficit related to lack of exposure to information as related to diagnosis of: ? CVD ? DM (A1c 6.5)    Obesity related to excessive energy intake as evidenced by a BMI of 30.3  Estimated Daily Nutrition Needs for: ? wt loss  1500-2000 Kcal , Total Fat 40-55gm, Saturated Fat 11-15 gm, Trans Fat 1.4-2.0 gm,  Sodium less than 1500 mg, Grams of CHO 175-250   Nutrition Intervention   Pt's individual nutrition plan including cholesterol goals reviewed with pt.   Benefits of adopting Therapeutic Lifestyle Changes discussed when Medficts reviewed.   Pt to attend the Portion Distortion class   Pt to attend the  ? Nutrition I class                     ? Nutrition II class        ? Diabetes Blitz class        ? Diabetes Q & A class   Pt given handouts for: ? wt loss ? DM    Continue client-centered nutrition education by RD, as part of interdisciplinary care. Goal(s)   Pt to identify and limit food sources of saturated fat, trans fat, and cholesterol   Pt to identify food quantities necessary to achieve: ? wt loss to a goal wt of 181-195 lb ( 82.3-88.6 kg) at graduation from cardiac rehab.  Monitor and Evaluate progress toward nutrition goal with team.

## 2011-08-04 ENCOUNTER — Encounter (HOSPITAL_COMMUNITY): Payer: Medicare Other

## 2011-08-11 ENCOUNTER — Telehealth (HOSPITAL_COMMUNITY): Payer: Self-pay | Admitting: *Deleted

## 2011-08-13 ENCOUNTER — Encounter (HOSPITAL_COMMUNITY)
Admission: RE | Admit: 2011-08-13 | Discharge: 2011-08-13 | Disposition: A | Discharge status: Home / Self Care | Payer: Medicare Other | Source: Ambulatory Visit | Attending: Cardiovascular Disease | Admitting: Cardiovascular Disease

## 2011-08-13 DIAGNOSIS — Z9861 Coronary angioplasty status: Secondary | ICD-10-CM | POA: Insufficient documentation

## 2011-08-13 DIAGNOSIS — Z5189 Encounter for other specified aftercare: Secondary | ICD-10-CM | POA: Insufficient documentation

## 2011-08-13 DIAGNOSIS — Z7982 Long term (current) use of aspirin: Secondary | ICD-10-CM | POA: Insufficient documentation

## 2011-08-13 DIAGNOSIS — E785 Hyperlipidemia, unspecified: Secondary | ICD-10-CM | POA: Insufficient documentation

## 2011-08-13 DIAGNOSIS — Z79899 Other long term (current) drug therapy: Secondary | ICD-10-CM | POA: Insufficient documentation

## 2011-08-13 DIAGNOSIS — I2510 Atherosclerotic heart disease of native coronary artery without angina pectoris: Secondary | ICD-10-CM | POA: Insufficient documentation

## 2011-08-13 DIAGNOSIS — E119 Type 2 diabetes mellitus without complications: Secondary | ICD-10-CM | POA: Insufficient documentation

## 2011-08-13 DIAGNOSIS — E669 Obesity, unspecified: Secondary | ICD-10-CM | POA: Insufficient documentation

## 2011-08-13 DIAGNOSIS — J4489 Other specified chronic obstructive pulmonary disease: Secondary | ICD-10-CM | POA: Insufficient documentation

## 2011-08-13 DIAGNOSIS — I200 Unstable angina: Secondary | ICD-10-CM | POA: Insufficient documentation

## 2011-08-13 DIAGNOSIS — Z87891 Personal history of nicotine dependence: Secondary | ICD-10-CM | POA: Insufficient documentation

## 2011-08-13 DIAGNOSIS — J449 Chronic obstructive pulmonary disease, unspecified: Secondary | ICD-10-CM | POA: Insufficient documentation

## 2011-08-13 DIAGNOSIS — I252 Old myocardial infarction: Secondary | ICD-10-CM | POA: Insufficient documentation

## 2011-08-13 DIAGNOSIS — E783 Hyperchylomicronemia: Secondary | ICD-10-CM | POA: Insufficient documentation

## 2011-08-13 LAB — GLUCOSE, CAPILLARY: Glucose-Capillary: 104 mg/dL — ABNORMAL HIGH (ref 70–99)

## 2011-08-16 ENCOUNTER — Encounter (HOSPITAL_COMMUNITY)
Admission: RE | Admit: 2011-08-16 | Discharge: 2011-08-16 | Disposition: A | Discharge status: Home / Self Care | Payer: Medicare Other | Source: Ambulatory Visit | Attending: Cardiovascular Disease | Admitting: Cardiovascular Disease

## 2011-08-16 LAB — GLUCOSE, CAPILLARY
Glucose-Capillary: 112 mg/dL — ABNORMAL HIGH (ref 70–99)
Glucose-Capillary: 85 mg/dL (ref 70–99)

## 2011-08-16 NOTE — Progress Notes (Signed)
CBG 85 after exercising on the treadmill at cardiac rehab. Bill was given lemonade peanut butter and graham crackers.  Repeat CBG 112 .

## 2011-08-18 ENCOUNTER — Encounter (HOSPITAL_COMMUNITY)
Admission: RE | Admit: 2011-08-18 | Discharge: 2011-08-18 | Disposition: A | Discharge status: Home / Self Care | Payer: Medicare Other | Source: Ambulatory Visit | Attending: Cardiovascular Disease | Admitting: Cardiovascular Disease

## 2011-08-18 LAB — GLUCOSE, CAPILLARY: Glucose-Capillary: 98 mg/dL (ref 70–99)

## 2011-08-20 ENCOUNTER — Encounter (HOSPITAL_COMMUNITY)
Admission: RE | Admit: 2011-08-20 | Discharge: 2011-08-20 | Disposition: A | Discharge status: Home / Self Care | Payer: Medicare Other | Source: Ambulatory Visit | Attending: Cardiovascular Disease | Admitting: Cardiovascular Disease

## 2011-08-20 LAB — GLUCOSE, CAPILLARY: Glucose-Capillary: 77 mg/dL (ref 70–99)

## 2011-08-20 NOTE — Progress Notes (Signed)
Spoke with pt.  Pt post-exercise CBG 77 mg/dL despite pt drinking juice pre-exercise.  Pt taking 1000 mg Metformin BID.  Pt encouraged to eat peanut butter and crackers or cheese and crackers before exercise given pt eats breakfast around 5 am and exercises at 945 am.  If a more substantial snack does not keep pt CBG's elevated enough for exercise, pt to contact MD re: ? Adjust Metformin to 500 mg BID.  Per discussion with Gladstone Lighter, RN, pt may try to hold his am Metformin on Monday 2/11 to see if it makes any difference in his pre-/post-exercise CBG's. Pt to meet with his "DM doctor" next month according to pt.  Pt's last a1c of 6.5 indicated pt blood glucose well-controlled. Continue client-centered nutrition education by RD as part of interdisciplinary care.  Monitor and evaluate progress toward nutrition goal with team.

## 2011-08-23 ENCOUNTER — Encounter (HOSPITAL_COMMUNITY)
Admission: RE | Admit: 2011-08-23 | Discharge: 2011-08-23 | Disposition: A | Discharge status: Home / Self Care | Payer: Medicare Other | Source: Ambulatory Visit | Attending: Cardiovascular Disease | Admitting: Cardiovascular Disease

## 2011-08-25 ENCOUNTER — Encounter (HOSPITAL_COMMUNITY)
Admission: RE | Admit: 2011-08-25 | Discharge: 2011-08-25 | Disposition: A | Discharge status: Home / Self Care | Payer: Medicare Other | Source: Ambulatory Visit | Attending: Cardiovascular Disease | Admitting: Cardiovascular Disease

## 2011-08-27 ENCOUNTER — Encounter (HOSPITAL_COMMUNITY): Payer: Medicare Other

## 2011-08-30 ENCOUNTER — Encounter (HOSPITAL_COMMUNITY)
Admission: RE | Admit: 2011-08-30 | Discharge: 2011-08-30 | Disposition: A | Discharge status: Home / Self Care | Payer: Medicare Other | Source: Ambulatory Visit | Attending: Cardiovascular Disease | Admitting: Cardiovascular Disease

## 2011-08-30 LAB — GLUCOSE, CAPILLARY: Glucose-Capillary: 109 mg/dL — ABNORMAL HIGH (ref 70–99)

## 2011-09-01 ENCOUNTER — Encounter (HOSPITAL_COMMUNITY)
Admission: RE | Admit: 2011-09-01 | Discharge: 2011-09-01 | Disposition: A | Discharge status: Home / Self Care | Payer: Medicare Other | Source: Ambulatory Visit | Attending: Cardiovascular Disease | Admitting: Cardiovascular Disease

## 2011-09-03 ENCOUNTER — Encounter (HOSPITAL_COMMUNITY): Payer: Medicare Other

## 2011-09-06 ENCOUNTER — Encounter (HOSPITAL_COMMUNITY): Payer: Medicare Other

## 2011-09-08 ENCOUNTER — Encounter (HOSPITAL_COMMUNITY)
Admission: RE | Admit: 2011-09-08 | Discharge: 2011-09-08 | Disposition: A | Discharge status: Home / Self Care | Payer: Medicare Other | Source: Ambulatory Visit | Attending: Cardiovascular Disease | Admitting: Cardiovascular Disease

## 2011-09-10 ENCOUNTER — Encounter (HOSPITAL_COMMUNITY)
Admission: RE | Admit: 2011-09-10 | Discharge: 2011-09-10 | Disposition: A | Discharge status: Home / Self Care | Payer: Medicare Other | Source: Ambulatory Visit | Attending: Cardiovascular Disease | Admitting: Cardiovascular Disease

## 2011-09-10 DIAGNOSIS — Z5189 Encounter for other specified aftercare: Secondary | ICD-10-CM | POA: Insufficient documentation

## 2011-09-10 DIAGNOSIS — J449 Chronic obstructive pulmonary disease, unspecified: Secondary | ICD-10-CM | POA: Insufficient documentation

## 2011-09-10 DIAGNOSIS — E669 Obesity, unspecified: Secondary | ICD-10-CM | POA: Insufficient documentation

## 2011-09-10 DIAGNOSIS — Z7982 Long term (current) use of aspirin: Secondary | ICD-10-CM | POA: Insufficient documentation

## 2011-09-10 DIAGNOSIS — I252 Old myocardial infarction: Secondary | ICD-10-CM | POA: Insufficient documentation

## 2011-09-10 DIAGNOSIS — Z79899 Other long term (current) drug therapy: Secondary | ICD-10-CM | POA: Insufficient documentation

## 2011-09-10 DIAGNOSIS — I200 Unstable angina: Secondary | ICD-10-CM | POA: Insufficient documentation

## 2011-09-10 DIAGNOSIS — E783 Hyperchylomicronemia: Secondary | ICD-10-CM | POA: Insufficient documentation

## 2011-09-10 DIAGNOSIS — Z87891 Personal history of nicotine dependence: Secondary | ICD-10-CM | POA: Insufficient documentation

## 2011-09-10 DIAGNOSIS — J4489 Other specified chronic obstructive pulmonary disease: Secondary | ICD-10-CM | POA: Insufficient documentation

## 2011-09-10 DIAGNOSIS — Z9861 Coronary angioplasty status: Secondary | ICD-10-CM | POA: Insufficient documentation

## 2011-09-10 DIAGNOSIS — E119 Type 2 diabetes mellitus without complications: Secondary | ICD-10-CM | POA: Insufficient documentation

## 2011-09-10 DIAGNOSIS — E785 Hyperlipidemia, unspecified: Secondary | ICD-10-CM | POA: Insufficient documentation

## 2011-09-10 DIAGNOSIS — I2510 Atherosclerotic heart disease of native coronary artery without angina pectoris: Secondary | ICD-10-CM | POA: Insufficient documentation

## 2011-09-13 ENCOUNTER — Encounter (HOSPITAL_COMMUNITY)
Admission: RE | Admit: 2011-09-13 | Discharge: 2011-09-13 | Disposition: A | Discharge status: Home / Self Care | Payer: Medicare Other | Source: Ambulatory Visit | Attending: Cardiovascular Disease | Admitting: Cardiovascular Disease

## 2011-09-13 LAB — GLUCOSE, CAPILLARY: Glucose-Capillary: 100 mg/dL — ABNORMAL HIGH (ref 70–99)

## 2011-09-15 ENCOUNTER — Encounter (HOSPITAL_COMMUNITY): Payer: Medicare Other

## 2011-09-17 ENCOUNTER — Encounter (HOSPITAL_COMMUNITY)
Admission: RE | Admit: 2011-09-17 | Discharge: 2011-09-17 | Disposition: A | Discharge status: Home / Self Care | Payer: Medicare Other | Source: Ambulatory Visit | Attending: Cardiovascular Disease | Admitting: Cardiovascular Disease

## 2011-09-17 LAB — GLUCOSE, CAPILLARY: Glucose-Capillary: 93 mg/dL (ref 70–99)

## 2011-09-20 ENCOUNTER — Encounter (HOSPITAL_COMMUNITY)
Admission: RE | Admit: 2011-09-20 | Discharge: 2011-09-20 | Disposition: A | Discharge status: Home / Self Care | Payer: Medicare Other | Source: Ambulatory Visit | Attending: Cardiovascular Disease | Admitting: Cardiovascular Disease

## 2011-09-20 LAB — GLUCOSE, CAPILLARY: Glucose-Capillary: 117 mg/dL — ABNORMAL HIGH (ref 70–99)

## 2011-09-22 ENCOUNTER — Encounter (HOSPITAL_COMMUNITY): Payer: Medicare Other

## 2011-09-24 ENCOUNTER — Encounter (HOSPITAL_COMMUNITY)
Admission: RE | Admit: 2011-09-24 | Discharge: 2011-09-24 | Disposition: A | Discharge status: Home / Self Care | Payer: Medicare Other | Source: Ambulatory Visit | Attending: Cardiovascular Disease | Admitting: Cardiovascular Disease

## 2011-09-27 ENCOUNTER — Encounter (HOSPITAL_COMMUNITY)
Admission: RE | Admit: 2011-09-27 | Discharge: 2011-09-27 | Disposition: A | Discharge status: Home / Self Care | Payer: Medicare Other | Source: Ambulatory Visit | Attending: Cardiovascular Disease | Admitting: Cardiovascular Disease

## 2011-09-29 ENCOUNTER — Encounter (HOSPITAL_COMMUNITY): Payer: Medicare Other

## 2011-10-01 ENCOUNTER — Encounter (HOSPITAL_COMMUNITY)
Admission: RE | Admit: 2011-10-01 | Discharge: 2011-10-01 | Disposition: A | Discharge status: Home / Self Care | Payer: Medicare Other | Source: Ambulatory Visit | Attending: Cardiovascular Disease | Admitting: Cardiovascular Disease

## 2011-10-01 LAB — GLUCOSE, CAPILLARY: Glucose-Capillary: 110 mg/dL — ABNORMAL HIGH (ref 70–99)

## 2011-10-04 ENCOUNTER — Encounter (HOSPITAL_COMMUNITY)
Admission: RE | Admit: 2011-10-04 | Discharge: 2011-10-04 | Disposition: A | Discharge status: Home / Self Care | Payer: Medicare Other | Source: Ambulatory Visit | Attending: Cardiovascular Disease | Admitting: Cardiovascular Disease

## 2011-10-04 LAB — GLUCOSE, CAPILLARY: Glucose-Capillary: 125 mg/dL — ABNORMAL HIGH (ref 70–99)

## 2011-10-06 ENCOUNTER — Encounter (HOSPITAL_COMMUNITY)
Admission: RE | Admit: 2011-10-06 | Discharge: 2011-10-06 | Disposition: A | Discharge status: Home / Self Care | Payer: Medicare Other | Source: Ambulatory Visit | Attending: Cardiovascular Disease | Admitting: Cardiovascular Disease

## 2011-10-08 ENCOUNTER — Encounter (HOSPITAL_COMMUNITY)
Admission: RE | Admit: 2011-10-08 | Discharge: 2011-10-08 | Disposition: A | Discharge status: Home / Self Care | Payer: Medicare Other | Source: Ambulatory Visit | Attending: Cardiovascular Disease | Admitting: Cardiovascular Disease

## 2011-10-11 ENCOUNTER — Encounter (HOSPITAL_COMMUNITY)
Admission: RE | Admit: 2011-10-11 | Discharge: 2011-10-11 | Disposition: A | Discharge status: Home / Self Care | Payer: Medicare Other | Source: Ambulatory Visit | Attending: Cardiovascular Disease | Admitting: Cardiovascular Disease

## 2011-10-11 DIAGNOSIS — I252 Old myocardial infarction: Secondary | ICD-10-CM | POA: Insufficient documentation

## 2011-10-11 DIAGNOSIS — I200 Unstable angina: Secondary | ICD-10-CM | POA: Insufficient documentation

## 2011-10-11 DIAGNOSIS — E783 Hyperchylomicronemia: Secondary | ICD-10-CM | POA: Insufficient documentation

## 2011-10-11 DIAGNOSIS — E669 Obesity, unspecified: Secondary | ICD-10-CM | POA: Insufficient documentation

## 2011-10-11 DIAGNOSIS — Z7982 Long term (current) use of aspirin: Secondary | ICD-10-CM | POA: Insufficient documentation

## 2011-10-11 DIAGNOSIS — Z79899 Other long term (current) drug therapy: Secondary | ICD-10-CM | POA: Insufficient documentation

## 2011-10-11 DIAGNOSIS — E119 Type 2 diabetes mellitus without complications: Secondary | ICD-10-CM | POA: Insufficient documentation

## 2011-10-11 DIAGNOSIS — J449 Chronic obstructive pulmonary disease, unspecified: Secondary | ICD-10-CM | POA: Insufficient documentation

## 2011-10-11 DIAGNOSIS — Z9861 Coronary angioplasty status: Secondary | ICD-10-CM | POA: Insufficient documentation

## 2011-10-11 DIAGNOSIS — Z5189 Encounter for other specified aftercare: Secondary | ICD-10-CM | POA: Insufficient documentation

## 2011-10-11 DIAGNOSIS — I2510 Atherosclerotic heart disease of native coronary artery without angina pectoris: Secondary | ICD-10-CM | POA: Insufficient documentation

## 2011-10-11 DIAGNOSIS — J4489 Other specified chronic obstructive pulmonary disease: Secondary | ICD-10-CM | POA: Insufficient documentation

## 2011-10-11 DIAGNOSIS — E785 Hyperlipidemia, unspecified: Secondary | ICD-10-CM | POA: Insufficient documentation

## 2011-10-11 DIAGNOSIS — Z87891 Personal history of nicotine dependence: Secondary | ICD-10-CM | POA: Insufficient documentation

## 2011-10-11 NOTE — Progress Notes (Signed)
Occasional PVC and PAC noted.  Vital signs stable. Patient asymptomatic will fax exercise flow sheets to Dr Alanda Amass for review.

## 2011-10-13 ENCOUNTER — Encounter (HOSPITAL_COMMUNITY)
Admission: RE | Admit: 2011-10-13 | Discharge: 2011-10-13 | Disposition: A | Discharge status: Home / Self Care | Payer: Medicare Other | Source: Ambulatory Visit | Attending: Cardiovascular Disease | Admitting: Cardiovascular Disease

## 2011-10-13 LAB — GLUCOSE, CAPILLARY: Glucose-Capillary: 102 mg/dL — ABNORMAL HIGH (ref 70–99)

## 2011-10-15 ENCOUNTER — Encounter (HOSPITAL_COMMUNITY)
Admission: RE | Admit: 2011-10-15 | Discharge: 2011-10-15 | Disposition: A | Discharge status: Home / Self Care | Payer: Medicare Other | Source: Ambulatory Visit | Attending: Cardiovascular Disease | Admitting: Cardiovascular Disease

## 2011-10-18 ENCOUNTER — Encounter (HOSPITAL_COMMUNITY): Payer: Medicare Other

## 2011-10-20 ENCOUNTER — Encounter (HOSPITAL_COMMUNITY)
Admission: RE | Admit: 2011-10-20 | Discharge: 2011-10-20 | Disposition: A | Discharge status: Home / Self Care | Payer: Medicare Other | Source: Ambulatory Visit | Attending: Cardiovascular Disease | Admitting: Cardiovascular Disease

## 2011-10-22 ENCOUNTER — Encounter (HOSPITAL_COMMUNITY): Payer: Medicare Other

## 2011-10-25 ENCOUNTER — Encounter (HOSPITAL_COMMUNITY)
Admission: RE | Admit: 2011-10-25 | Discharge: 2011-10-25 | Disposition: A | Discharge status: Home / Self Care | Payer: Medicare Other | Source: Ambulatory Visit | Attending: Cardiovascular Disease | Admitting: Cardiovascular Disease

## 2011-10-27 ENCOUNTER — Encounter (HOSPITAL_COMMUNITY)
Admission: RE | Admit: 2011-10-27 | Discharge: 2011-10-27 | Disposition: A | Discharge status: Home / Self Care | Payer: Medicare Other | Source: Ambulatory Visit | Attending: Cardiovascular Disease | Admitting: Cardiovascular Disease

## 2011-10-29 ENCOUNTER — Encounter (HOSPITAL_COMMUNITY)
Admission: RE | Admit: 2011-10-29 | Discharge: 2011-10-29 | Disposition: A | Discharge status: Home / Self Care | Payer: Medicare Other | Source: Ambulatory Visit | Attending: Cardiovascular Disease | Admitting: Cardiovascular Disease

## 2011-11-01 ENCOUNTER — Encounter (HOSPITAL_COMMUNITY)
Admission: RE | Admit: 2011-11-01 | Discharge: 2011-11-01 | Disposition: A | Discharge status: Home / Self Care | Payer: Medicare Other | Source: Ambulatory Visit | Attending: Cardiovascular Disease | Admitting: Cardiovascular Disease

## 2011-11-01 NOTE — Progress Notes (Signed)
Victor Morgan has a follow up appointment with Dr Alanda Amass tomorrow. Will fax exercise flow sheets to Dr Kandis Cocking office for review.

## 2011-11-03 ENCOUNTER — Encounter (HOSPITAL_COMMUNITY)
Admission: RE | Admit: 2011-11-03 | Discharge: 2011-11-03 | Disposition: A | Discharge status: Home / Self Care | Payer: Medicare Other | Source: Ambulatory Visit | Attending: Cardiovascular Disease | Admitting: Cardiovascular Disease

## 2011-11-05 ENCOUNTER — Encounter (HOSPITAL_COMMUNITY)
Admission: RE | Admit: 2011-11-05 | Discharge: 2011-11-05 | Disposition: A | Discharge status: Home / Self Care | Payer: Medicare Other | Source: Ambulatory Visit | Attending: Cardiovascular Disease | Admitting: Cardiovascular Disease

## 2011-11-08 ENCOUNTER — Encounter (HOSPITAL_COMMUNITY)
Admission: RE | Admit: 2011-11-08 | Discharge: 2011-11-08 | Disposition: A | Discharge status: Home / Self Care | Payer: Medicare Other | Source: Ambulatory Visit | Attending: Cardiovascular Disease | Admitting: Cardiovascular Disease

## 2011-11-08 LAB — GLUCOSE, CAPILLARY: Glucose-Capillary: 120 mg/dL — ABNORMAL HIGH (ref 70–99)

## 2011-11-08 NOTE — Progress Notes (Signed)
Victor Morgan had a short run of an irregular heart beat questionably atrial fibrillation. Patient asymptomatic bp 122/60 SAO2 96 % on room air. ECG tracings faxed to Dr Kandis Cocking office for review.  Talked with J[facility] Dr. Kandis Cocking  Nurse . Dr. Alanda Amass to review  ECG tracing's. Victor Morgan did not exercise today. Dr Kandis Cocking office will contact the patient at home if further intervention is needed.

## 2011-11-10 ENCOUNTER — Encounter (HOSPITAL_COMMUNITY)
Admission: RE | Admit: 2011-11-10 | Discharge: 2011-11-10 | Disposition: A | Discharge status: Home / Self Care | Payer: Medicare Other | Source: Ambulatory Visit | Attending: Cardiovascular Disease | Admitting: Cardiovascular Disease

## 2011-11-10 DIAGNOSIS — E119 Type 2 diabetes mellitus without complications: Secondary | ICD-10-CM | POA: Insufficient documentation

## 2011-11-10 DIAGNOSIS — Z79899 Other long term (current) drug therapy: Secondary | ICD-10-CM | POA: Insufficient documentation

## 2011-11-10 DIAGNOSIS — J449 Chronic obstructive pulmonary disease, unspecified: Secondary | ICD-10-CM | POA: Insufficient documentation

## 2011-11-10 DIAGNOSIS — E783 Hyperchylomicronemia: Secondary | ICD-10-CM | POA: Insufficient documentation

## 2011-11-10 DIAGNOSIS — Z9861 Coronary angioplasty status: Secondary | ICD-10-CM | POA: Insufficient documentation

## 2011-11-10 DIAGNOSIS — E669 Obesity, unspecified: Secondary | ICD-10-CM | POA: Insufficient documentation

## 2011-11-10 DIAGNOSIS — E785 Hyperlipidemia, unspecified: Secondary | ICD-10-CM | POA: Insufficient documentation

## 2011-11-10 DIAGNOSIS — Z87891 Personal history of nicotine dependence: Secondary | ICD-10-CM | POA: Insufficient documentation

## 2011-11-10 DIAGNOSIS — J4489 Other specified chronic obstructive pulmonary disease: Secondary | ICD-10-CM | POA: Insufficient documentation

## 2011-11-10 DIAGNOSIS — Z7982 Long term (current) use of aspirin: Secondary | ICD-10-CM | POA: Insufficient documentation

## 2011-11-10 DIAGNOSIS — I252 Old myocardial infarction: Secondary | ICD-10-CM | POA: Insufficient documentation

## 2011-11-10 DIAGNOSIS — I200 Unstable angina: Secondary | ICD-10-CM | POA: Insufficient documentation

## 2011-11-10 DIAGNOSIS — Z5189 Encounter for other specified aftercare: Secondary | ICD-10-CM | POA: Insufficient documentation

## 2011-11-10 DIAGNOSIS — I2510 Atherosclerotic heart disease of native coronary artery without angina pectoris: Secondary | ICD-10-CM | POA: Insufficient documentation

## 2011-11-10 NOTE — Progress Notes (Signed)
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. No ectopy noted today.   Dr Alanda Amass wants Mr Kathrin Ruddy to increase his metoprolol to 75 mg twice a day. The patient is supposed to get an event monitor at Dr Kandis Cocking office.

## 2011-11-12 ENCOUNTER — Encounter (HOSPITAL_COMMUNITY): Payer: Medicare Other

## 2011-11-15 ENCOUNTER — Encounter (HOSPITAL_COMMUNITY): Payer: Medicare Other

## 2011-11-17 ENCOUNTER — Encounter (HOSPITAL_COMMUNITY): Payer: Medicare Other

## 2011-11-19 ENCOUNTER — Encounter (HOSPITAL_COMMUNITY): Payer: Medicare Other

## 2012-04-13 ENCOUNTER — Encounter: Payer: Self-pay | Admitting: Critical Care Medicine

## 2012-04-13 ENCOUNTER — Ambulatory Visit (HOSPITAL_BASED_OUTPATIENT_CLINIC_OR_DEPARTMENT_OTHER)
Admission: RE | Admit: 2012-04-13 | Discharge: 2012-04-13 | Disposition: A | Discharge status: Home / Self Care | Payer: Medicare Other | Source: Ambulatory Visit | Attending: Critical Care Medicine | Admitting: Critical Care Medicine

## 2012-04-13 ENCOUNTER — Ambulatory Visit (INDEPENDENT_AMBULATORY_CARE_PROVIDER_SITE_OTHER): Payer: Medicare Other | Admitting: Critical Care Medicine

## 2012-04-13 VITALS — BP 160/80 | HR 61 | Temp 98.4°F | Ht 70.0 in | Wt 232.0 lb

## 2012-04-13 DIAGNOSIS — J329 Chronic sinusitis, unspecified: Secondary | ICD-10-CM

## 2012-04-13 DIAGNOSIS — J3489 Other specified disorders of nose and nasal sinuses: Secondary | ICD-10-CM | POA: Insufficient documentation

## 2012-04-13 DIAGNOSIS — R059 Cough, unspecified: Secondary | ICD-10-CM | POA: Insufficient documentation

## 2012-04-13 DIAGNOSIS — R05 Cough: Secondary | ICD-10-CM

## 2012-04-13 DIAGNOSIS — G319 Degenerative disease of nervous system, unspecified: Secondary | ICD-10-CM | POA: Insufficient documentation

## 2012-04-13 DIAGNOSIS — Z23 Encounter for immunization: Secondary | ICD-10-CM

## 2012-04-13 NOTE — Progress Notes (Signed)
Subjective:    Patient ID: Victor Morgan, male    DOB: [birth year], 78 y.o.   MRN: [id number]  HPI Comments: Chronic cough for 75yrs , intermittent.  Worse with seasons, sinus issues, then into the chest and rx ABX.  On 4 diff ABX , last one ceftin 500mg  bid 10days and helped.   Cough This is a chronic problem. The current episode started more than 1 year ago. The problem has been rapidly improving. The problem occurs every few hours. The cough is productive of purulent sputum. Associated symptoms include chills, postnasal drip and wheezing. Pertinent negatives include no chest pain, ear congestion, ear pain, fever, headaches, heartburn, hemoptysis, myalgias, nasal congestion, rhinorrhea, sore throat, shortness of breath, sweats or weight loss. Associated symptoms comments: Uses netipot daily, occ gray mucus, now white stringy. Exacerbated by: heat will make cough worse. Risk factors for lung disease include smoking/tobacco exposure. He has tried a beta-agonist inhaler and steroid inhaler for the symptoms. The treatment provided no relief. His past medical history is significant for bronchitis, COPD and pneumonia. There is no history of asthma, bronchiectasis, emphysema or environmental allergies. allergy testing neg on skin testing except mold, pt dx pneumonia recently    Past Medical History  Diagnosis Date  . COPD (chronic obstructive pulmonary disease)   . Diabetes mellitus     type II  . Hyperlipidemia   . Sleep apnea, obstructive     CPAP NIGHTLY  . Myocardial infarction 1997  . Hx of heart artery stent 1997    PTA  AND STENT X2 TO LAD,STENT TO CICUMFLEX  . Obesity, mild   . Hypertension      Family History  Problem Relation Age of Onset  . Colon cancer Mother      History   Social History  . Marital Status: Married    Spouse Name: N/A    Number of Children: N/A  . Years of Education: N/A   Occupational History  . 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 Smoking status: Former Smoker -- 1.0 packs/day for 45 years    Types: Cigarettes    Quit date: 05/19/1996  . Smokeless tobacco: Never Used  . Alcohol Use: No  . Drug Use: Not on file  . Sexually Active: Not on file   Other Topics Concern  . Not on file   Social History Narrative  . No narrative on file     No Known Allergies   Outpatient Prescriptions Prior to Visit  Medication Sig Dispense Refill  . fish oil-omega-3 fatty acids 1000 MG capsule Take 2 g by mouth daily.        . hydrochlorothiazide (HYDRODIURIL) 25 MG tablet Take 25 mg by mouth daily.        . metFORMIN (GLUCOPHAGE) 1000 MG tablet Take 1,000 mg by mouth 2 (two) times daily with a meal.        . omeprazole (PRILOSEC) 20 MG capsule Take 20 mg by mouth daily.        . rosuvastatin (CRESTOR) 20 MG tablet Take 20 mg by mouth daily.        Marland Kitchen triamcinolone cream (KENALOG) 0.1 % Apply topically 2 (two) times daily as needed.        . vitamin B-12 (CYANOCOBALAMIN) 1000 MCG tablet Take 1,000 mcg by mouth daily.        . vitamin C (ASCORBIC ACID) 500 MG tablet Take 2,000 mg by mouth  daily.       . vitamin E 400 UNIT capsule Take 400 Units by mouth daily.        Marland Kitchen guaiFENesin (MUCINEX) 600 MG 12 hr tablet Take 1 tablet (600 mg total) by mouth 2 (two) times daily as needed for congestion.      . metoprolol (LOPRESSOR) 50 MG tablet Take 1 tablet (50 mg total) by mouth 2 (two) times daily.  60 tablet  1  . mometasone (ASMANEX) 220 MCG/INH inhaler Inhale 2 puffs into the lungs daily. At hour of sleep       . acetaminophen (TYLENOL) 500 MG tablet Take 500 mg by mouth every 6 (six) hours as needed.        Marland Kitchen albuterol-ipratropium (COMBIVENT) 18-103 MCG/ACT inhaler Inhale 2 puffs into the lungs as needed.        Marland Kitchen aspirin EC 325 MG tablet Take 325 mg by mouth daily. Takes at hs      . folic acid (FOLVITE) 1 MG tablet Take 1 mg by mouth 2 (two) times daily.        . formoterol (FORADIL) 12 MCG capsule for inhaler Place 12 mcg  into inhaler and inhale 2 (two) times daily.        . prednisoLONE acetate (PRED FORTE) 1 % ophthalmic suspension Place 1 drop into the left eye daily.        . Travoprost, BAK Free, (TRAVATAMN) 0.004 % SOLN ophthalmic solution Place 1 drop into the left eye at bedtime.            Review of Systems  Constitutional: Positive for chills and activity change. Negative for fever, weight loss, diaphoresis, appetite change, fatigue and unexpected weight change.  HENT: Positive for congestion, sneezing and postnasal drip. Negative for hearing loss, ear pain, nosebleeds, sore throat, facial swelling, rhinorrhea, mouth sores, trouble swallowing, neck pain, neck stiffness, dental problem, voice change, sinus pressure, tinnitus and ear discharge.   Eyes: Positive for itching. Negative for photophobia, discharge and visual disturbance.  Respiratory: Positive for cough and wheezing. Negative for apnea, hemoptysis, choking, chest tightness, shortness of breath and stridor.   Cardiovascular: Negative for chest pain, palpitations and leg swelling.  Gastrointestinal: Negative for heartburn, nausea, vomiting, abdominal pain, constipation, blood in stool and abdominal distention.  Genitourinary: Negative for dysuria, urgency, frequency, hematuria, flank pain, decreased urine volume and difficulty urinating.  Musculoskeletal: Negative for myalgias, back pain, joint swelling, arthralgias and gait problem.  Skin: Negative for color change and pallor.  Neurological: Positive for dizziness and light-headedness. Negative for tremors, seizures, syncope, speech difficulty, weakness, numbness and headaches.  Hematological: Negative for environmental allergies and adenopathy. Does not bruise/bleed easily.  Psychiatric/Behavioral: Negative for confusion, disturbed wake/sleep cycle and agitation. The patient is not nervous/anxious.        Objective:   Physical Exam Filed Vitals:   04/13/12 1058  BP: 160/80  Pulse: 61    Temp: 98.4 F (36.9 C)  TempSrc: Oral  Height: 5\' 10"  (1.778 m)  Weight: 105.235 kg (232 lb)  SpO2: 96%    Gen: Pleasant, well-nourished, in no distress,  normal affect  ENT: No lesions,  mouth clear,  oropharynx clear, ++ postnasal drip, no nasal purulence  Neck: No JVD, no TMG, no carotid bruits  Lungs: No use of accessory muscles, no dullness to percussion, clear without rales or rhonchi  Cardiovascular: RRR, heart sounds normal, no murmur or gallops, no peripheral edema  Abdomen: soft and NT, no HSM,  BS  normal  Musculoskeletal: No deformities, no cyanosis or clubbing  Neuro: alert, non focal  Skin: Warm, no lesions or rashes  CXR : NAD Spiro: mild restriction, no obstruction  CT Sinus: moderate chronic ethmoid sinusitis        Assessment & Plan:   Chronic sinusitis Chronic sinusitis with associated cyclical cough  PFTs do NOT show Copd ore airway obstruction Plan Flu vaccine was given Stop asmanex Chill fish oil capsules Use combivent as needed Stay on flonase    Updated Medication List Outpatient Encounter Prescriptions as of 04/13/2012  Medication Sig Dispense Refill  . aspirin 81 MG tablet Take 81 mg by mouth 2 (two) times daily.      . cetirizine (ZYRTEC) 10 MG chewable tablet Chew 10 mg by mouth daily.      . fish oil-omega-3 fatty acids 1000 MG capsule Take 2 g by mouth daily.        . fluticasone (FLONASE) 50 MCG/ACT nasal spray Place 2 sprays into the nose 2 (two) times daily.      Marland Kitchen guaiFENesin (MUCINEX) 600 MG 12 hr tablet Take 600 mg by mouth 2 (two) times daily.      . hydrochlorothiazide (HYDRODIURIL) 25 MG tablet Take 25 mg by mouth daily.        . Ipratropium-Albuterol (COMBIVENT RESPIMAT) 20-100 MCG/ACT AERS respimat Inhale 1 puff into the lungs 4 (four) times daily as needed.      . metFORMIN (GLUCOPHAGE) 1000 MG tablet Take 1,000 mg by mouth 2 (two) times daily with a meal.        . metoprolol (LOPRESSOR) 50 MG tablet Take 75 mg by  mouth 2 (two) times daily.      . nitroGLYCERIN (NITROSTAT) 0.4 MG SL tablet Place 0.4 mg under the tongue every 5 (five) minutes as needed.      Marland Kitchen omeprazole (PRILOSEC) 20 MG capsule Take 20 mg by mouth daily.        . rosuvastatin (CRESTOR) 20 MG tablet Take 20 mg by mouth daily.        . Sodium Chloride-Sodium Bicarb (SINUS WASH NETI POT NA) Place into the nose 2 (two) times daily as needed.      . triamcinolone cream (KENALOG) 0.1 % Apply topically 2 (two) times daily as needed.        . vitamin B-12 (CYANOCOBALAMIN) 1000 MCG tablet Take 1,000 mcg by mouth daily.        . vitamin C (ASCORBIC ACID) 500 MG tablet Take 2,000 mg by mouth daily.       . vitamin E 400 UNIT capsule Take 400 Units by mouth daily.        Marland Kitchen DISCONTD: guaiFENesin (MUCINEX) 600 MG 12 hr tablet Take 1 tablet (600 mg total) by mouth 2 (two) times daily as needed for congestion.      Marland Kitchen DISCONTD: metoprolol (LOPRESSOR) 50 MG tablet Take 1 tablet (50 mg total) by mouth 2 (two) times daily.  60 tablet  1  . DISCONTD: mometasone (ASMANEX) 220 MCG/INH inhaler Inhale 2 puffs into the lungs daily. At hour of sleep       . DISCONTD: acetaminophen (TYLENOL) 500 MG tablet Take 500 mg by mouth every 6 (six) hours as needed.        Marland Kitchen DISCONTD: albuterol-ipratropium (COMBIVENT) 18-103 MCG/ACT inhaler Inhale 2 puffs into the lungs as needed.        Marland Kitchen DISCONTD: aspirin EC 325 MG tablet Take 325 mg by mouth daily. Takes  at hs      . DISCONTD: folic acid (FOLVITE) 1 MG tablet Take 1 mg by mouth 2 (two) times daily.        Marland Kitchen DISCONTD: formoterol (FORADIL) 12 MCG capsule for inhaler Place 12 mcg into inhaler and inhale 2 (two) times daily.        Marland Kitchen DISCONTD: prednisoLONE acetate (PRED FORTE) 1 % ophthalmic suspension Place 1 drop into the left eye daily.        Marland Kitchen DISCONTD: Travoprost, BAK Free, (TRAVATAMN) 0.004 % SOLN ophthalmic solution Place 1 drop into the left eye at bedtime.

## 2012-04-13 NOTE — Patient Instructions (Addendum)
Flu vaccine was given Stop asmanex Chill fish oil capsules Use combivent as needed Stay on flonase A CT Scan of the sinus will be obtained I will call with CT Scan results Return 2 months

## 2012-04-15 DIAGNOSIS — J329 Chronic sinusitis, unspecified: Secondary | ICD-10-CM | POA: Insufficient documentation

## 2012-04-15 NOTE — Assessment & Plan Note (Addendum)
Chronic sinusitis with associated cyclical cough  PFTs do NOT show Copd ore airway obstruction Plan Flu vaccine was given Stop asmanex Chill fish oil capsules Use combivent as needed Stay on flonase

## 2012-08-03 ENCOUNTER — Ambulatory Visit (INDEPENDENT_AMBULATORY_CARE_PROVIDER_SITE_OTHER): Payer: Medicare Other | Admitting: Critical Care Medicine

## 2012-08-03 ENCOUNTER — Encounter: Payer: Self-pay | Admitting: Critical Care Medicine

## 2012-08-03 VITALS — BP 126/82 | HR 62 | Temp 97.8°F | Ht 70.0 in | Wt 225.0 lb

## 2012-08-03 DIAGNOSIS — J329 Chronic sinusitis, unspecified: Secondary | ICD-10-CM

## 2012-08-03 DIAGNOSIS — R05 Cough: Secondary | ICD-10-CM

## 2012-08-03 MED ORDER — PREDNISONE 10 MG PO TABS: ORAL_TABLET | ORAL | Status: DC

## 2012-08-03 MED ORDER — AZITHROMYCIN 250 MG PO TABS: ORAL_TABLET | ORAL | Status: DC

## 2012-08-03 MED ORDER — OMEGA-3 FATTY ACIDS 1000 MG PO CAPS: ORAL_CAPSULE | ORAL | Status: DC

## 2012-08-03 MED ORDER — CEFUROXIME AXETIL 500 MG PO TABS: 500.0000 mg | ORAL_TABLET | Freq: Two times a day (BID) | ORAL | Status: DC

## 2012-08-03 NOTE — Patient Instructions (Addendum)
HOLD fish oil Take ceftin and azithromycin together along with prednisone taper>>all sent to pharmacy downstairs Stay on flonase Rinse sinuses twice daily, use [facility] Follow strict reflux diet Keep sugar free lozenge in throat at all times, train self to swallow, not cough Return 2 weeks

## 2012-08-03 NOTE — Assessment & Plan Note (Signed)
Chronic recurrent sinusitis with associated bronchitis with cyclical cough, exacerbated by reflux disease Plan HOLD fish oil Take ceftin and azithromycin together along with prednisone taper>>all sent to pharmacy downstairs Stay on flonase Rinse sinuses twice daily, use [facility] Follow strict reflux diet Keep sugar free lozenge in throat at all times, train self to swallow, not cough Return 2 weeks

## 2012-08-03 NOTE — Progress Notes (Signed)
Subjective:    Patient ID: Victor Morgan, male    DOB: 11-Jun-1933, 79 y.o.   MRN: [id number]  HPI Comments: Chronic cough for 43yrs , intermittent.  08/03/2012 Pt did well, improved.   Then worse 07/20/12: symptoms were coughing , more congested, sinus drain, postnasal drip.  Mucus out of nose thick, cough up is yellow. No hemoptysis.  No dyspnea.  No chest pain. No f/c/s.    Pcp rx amoxil first of January.   Past Medical History  Diagnosis Date  . Cough     chronic cough d/t sinusitis  . Diabetes mellitus     type II  . Hyperlipidemia   . Sleep apnea, obstructive     CPAP NIGHTLY  . Myocardial infarction 1997  . Hx of heart artery stent 1997    PTA  AND STENT X2 TO LAD,STENT TO CICUMFLEX  . Obesity, mild   . Hypertension      Family History  Problem Relation Age of Onset  . Colon cancer Mother      History   Social History  . Marital Status: Married    Spouse Name: N/A    Number of Children: N/A  . Years of Education: N/A   Occupational History  . 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 Smoking status: Former Smoker -- 1.0 packs/day for 45 years    Types: Cigarettes    Quit date: 05/19/1996  . Smokeless tobacco: Never Used  . Alcohol Use: No  . Drug Use: Not on file  . Sexually Active: Not on file   Other Topics Concern  . Not on file   Social History Narrative  . No narrative on file     No Known Allergies   Outpatient Prescriptions Prior to Visit  Medication Sig Dispense Refill  . aspirin 81 MG tablet Take 81 mg by mouth 2 (two) times daily.      . cetirizine (ZYRTEC) 10 MG chewable tablet Chew 10 mg by mouth daily.      . fluticasone (FLONASE) 50 MCG/ACT nasal spray Place 2 sprays into the nose 2 (two) times daily.      . hydrochlorothiazide (HYDRODIURIL) 25 MG tablet Take 25 mg by mouth daily.        . Ipratropium-Albuterol (COMBIVENT RESPIMAT) 20-100 MCG/ACT AERS respimat Inhale 1 puff into the lungs 4 (four) times daily as  needed.      . metFORMIN (GLUCOPHAGE) 1000 MG tablet Take 1,000 mg by mouth 2 (two) times daily with a meal.        . metoprolol (LOPRESSOR) 50 MG tablet Take 75 mg by mouth 2 (two) times daily.      . nitroGLYCERIN (NITROSTAT) 0.4 MG SL tablet Place 0.4 mg under the tongue every 5 (five) minutes as needed.      Marland Kitchen omeprazole (PRILOSEC) 20 MG capsule Take 20 mg by mouth daily.        . Sodium Chloride-Sodium Bicarb (SINUS WASH NETI POT NA) Place into the nose 2 (two) times daily as needed.      . triamcinolone cream (KENALOG) 0.1 % Apply topically 2 (two) times daily as needed.        . vitamin B-12 (CYANOCOBALAMIN) 1000 MCG tablet Take 1,000 mcg by mouth daily.        . vitamin C (ASCORBIC ACID) 500 MG tablet Take 1,000 mg by mouth daily.       . vitamin E 400  UNIT capsule Take 400 Units by mouth daily.        . [DISCONTINUED] fish oil-omega-3 fatty acids 1000 MG capsule Take 2 g by mouth daily.        . [DISCONTINUED] rosuvastatin (CRESTOR) 20 MG tablet Take 20 mg by mouth daily.        Last reviewed on 08/03/2012  9:44 AM by Storm Frisk, MD    Review of Systems  Constitutional: Positive for activity change. Negative for diaphoresis, appetite change, fatigue and unexpected weight change.  HENT: Positive for congestion and sneezing. Negative for hearing loss, nosebleeds, facial swelling, mouth sores, trouble swallowing, neck pain, neck stiffness, dental problem, voice change, sinus pressure, tinnitus and ear discharge.   Eyes: Positive for itching. Negative for photophobia, discharge and visual disturbance.  Respiratory: Negative for apnea, choking, chest tightness and stridor.   Cardiovascular: Negative for palpitations and leg swelling.  Gastrointestinal: Negative for nausea, vomiting, abdominal pain, constipation, blood in stool and abdominal distention.  Genitourinary: Negative for dysuria, urgency, frequency, hematuria, flank pain, decreased urine volume and difficulty urinating.    Musculoskeletal: Negative for back pain, joint swelling, arthralgias and gait problem.  Skin: Negative for color change and pallor.  Neurological: Positive for dizziness and light-headedness. Negative for tremors, seizures, syncope, speech difficulty, weakness and numbness.  Hematological: Negative for adenopathy. Does not bruise/bleed easily.  Psychiatric/Behavioral: Negative for confusion, sleep disturbance and agitation. The patient is not nervous/anxious.        Objective:   Physical Exam  Filed Vitals:   08/03/12 0932  BP: 126/82  Pulse: 62  Temp: 97.8 F (36.6 C)  TempSrc: Oral  Height: 5\' 10"  (1.778 m)  Weight: 225 lb (102.059 kg)  SpO2: 95%    Gen: Pleasant, well-nourished, in no distress,  normal affect  ENT: No lesions,  mouth clear,  oropharynx clear, ++ postnasal drip, no nasal purulence  Neck: No JVD, no TMG, no carotid bruits  Lungs: No use of accessory muscles, no dullness to percussion, clear without rales or rhonchi  Cardiovascular: RRR, heart sounds normal, no murmur or gallops, no peripheral edema  Abdomen: soft and NT, no HSM,  BS normal  Musculoskeletal: No deformities, no cyanosis or clubbing  Neuro: alert, non focal  Skin: Warm, no lesions or rashes      Assessment & Plan:   Chronic sinusitis Chronic recurrent sinusitis with associated bronchitis with cyclical cough, exacerbated by reflux disease Plan HOLD fish oil Take ceftin and azithromycin together along with prednisone taper>>all sent to pharmacy downstairs Stay on flonase Rinse sinuses twice daily, use [facility] Follow strict reflux diet Keep sugar free lozenge in throat at all times, train self to swallow, not cough Return 2 weeks     Updated Medication List Outpatient Encounter Prescriptions as of 08/03/2012  Medication Sig Dispense Refill  . aspirin 81 MG tablet Take 81 mg by mouth 2 (two) times daily.      Marland Kitchen atorvastatin (LIPITOR) 80 MG tablet Take 40 mg by mouth  daily.      . cetirizine (ZYRTEC) 10 MG chewable tablet Chew 10 mg by mouth daily.      . fish oil-omega-3 fatty acids 1000 MG capsule HOLD for NOW      . fluticasone (FLONASE) 50 MCG/ACT nasal spray Place 2 sprays into the nose 2 (two) times daily.      . hydrochlorothiazide (HYDRODIURIL) 25 MG tablet Take 25 mg by mouth daily.        Marland Kitchen  Ipratropium-Albuterol (COMBIVENT RESPIMAT) 20-100 MCG/ACT AERS respimat Inhale 1 puff into the lungs 4 (four) times daily as needed.      . metFORMIN (GLUCOPHAGE) 1000 MG tablet Take 1,000 mg by mouth 2 (two) times daily with a meal.        . metoprolol (LOPRESSOR) 50 MG tablet Take 75 mg by mouth 2 (two) times daily.      . multivitamin-lutein (OCUVITE-LUTEIN) CAPS Take 1 capsule by mouth daily.      . nitroGLYCERIN (NITROSTAT) 0.4 MG SL tablet Place 0.4 mg under the tongue every 5 (five) minutes as needed.      Marland Kitchen omeprazole (PRILOSEC) 20 MG capsule Take 20 mg by mouth daily.        . prednisoLONE acetate (PRED FORTE) 1 % ophthalmic suspension Place 1 drop into the left eye daily.      . Sodium Chloride-Sodium Bicarb (SINUS WASH NETI POT NA) Place into the nose 2 (two) times daily as needed.      . triamcinolone cream (KENALOG) 0.1 % Apply topically 2 (two) times daily as needed.        . vitamin B-12 (CYANOCOBALAMIN) 1000 MCG tablet Take 1,000 mcg by mouth daily.        . vitamin C (ASCORBIC ACID) 500 MG tablet Take 1,000 mg by mouth daily.       . vitamin E 400 UNIT capsule Take 400 Units by mouth daily.        . [DISCONTINUED] fish oil-omega-3 fatty acids 1000 MG capsule Take 2 g by mouth daily.        Marland Kitchen azithromycin (ZITHROMAX) 250 MG tablet Take two once then one daily until gone  10 tablet  0  . cefUROXime (CEFTIN) 500 MG tablet Take 1 tablet (500 mg total) by mouth 2 (two) times daily.  20 tablet  0  . predniSONE (DELTASONE) 10 MG tablet Take 4 for two days three for two days two for two days one for two days  20 tablet  0  . [DISCONTINUED] rosuvastatin  (CRESTOR) 20 MG tablet Take 20 mg by mouth daily.

## 2012-08-17 ENCOUNTER — Ambulatory Visit (INDEPENDENT_AMBULATORY_CARE_PROVIDER_SITE_OTHER): Payer: Medicare Other | Admitting: Critical Care Medicine

## 2012-08-17 ENCOUNTER — Encounter: Payer: Self-pay | Admitting: Critical Care Medicine

## 2012-08-17 VITALS — BP 110/76 | HR 64 | Temp 98.3°F | Ht 70.0 in | Wt 227.0 lb

## 2012-08-17 DIAGNOSIS — J329 Chronic sinusitis, unspecified: Secondary | ICD-10-CM

## 2012-08-17 MED ORDER — OMEGA-3 FATTY ACIDS 1000 MG PO CAPS: ORAL_CAPSULE | ORAL | Status: DC

## 2012-08-17 NOTE — Assessment & Plan Note (Signed)
Chronic sinusitis now improved with associated cyclical cough also improved Plan Continue Flonase daily along with sinus rinse No additional antibiotics indicated Sugar-free lozenge to be continued for throat moisturizer  Return as needed

## 2012-08-17 NOTE — Patient Instructions (Addendum)
Stay on flonase and sinus rinse Keep fish oil chilled Return as needed Remember sugar free lozenges

## 2012-08-17 NOTE — Progress Notes (Signed)
Subjective:    Patient ID: Victor Morgan, male    DOB: 29-Jul-1932, 79 y.o.   MRN: [id number]  HPI Comments: Chronic cough for 28yrs , intermittent.  08/03/2012 Pt did well, improved.   Then worse 07/20/12: symptoms were coughing , more congested, sinus drain, postnasal drip.  Mucus out of nose thick, cough up is yellow. No hemoptysis.  No dyspnea.  No chest pain. No f/c/s.    Pcp rx amoxil first of January.    08/17/2012 The patient is much improved from the last visit. The patient has no additional coughing. The patient's dilatation clears the throat. There is no sinus pressure. There is no dyspnea. There is no fever chills or sweats. The patient's finish current course of antibiotics and prednisone. The patient continues and nasal steroid Past Medical History  Diagnosis Date  . Cough     chronic cough d/t sinusitis  . Diabetes mellitus     type II  . Hyperlipidemia   . Sleep apnea, obstructive     CPAP NIGHTLY  . Myocardial infarction 1997  . Hx of heart artery stent 1997    PTA  AND STENT X2 TO LAD,STENT TO CICUMFLEX  . Obesity, mild   . Hypertension      Family History  Problem Relation Age of Onset  . Colon cancer Mother      History   Social History  . Marital Status: Married    Spouse Name: N/A    Number of Children: N/A  . Years of Education: N/A   Occupational History  . 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 Smoking status: Former Smoker -- 1.0 packs/day for 45 years    Types: Cigarettes    Quit date: 05/19/1996  . Smokeless tobacco: Never Used  . Alcohol Use: No  . Drug Use: Not on file  . Sexually Active: Not on file   Other Topics Concern  . Not on file   Social History Narrative  . No narrative on file     No Known Allergies   Outpatient Prescriptions Prior to Visit  Medication Sig Dispense Refill  . aspirin 81 MG tablet Take 81 mg by mouth 2 (two) times daily.      Marland Kitchen atorvastatin (LIPITOR) 80 MG tablet Take 40 mg by  mouth daily.      . cetirizine (ZYRTEC) 10 MG chewable tablet Chew 10 mg by mouth daily.      . fluticasone (FLONASE) 50 MCG/ACT nasal spray Place 2 sprays into the nose 2 (two) times daily.      . hydrochlorothiazide (HYDRODIURIL) 25 MG tablet Take 25 mg by mouth daily.        . Ipratropium-Albuterol (COMBIVENT RESPIMAT) 20-100 MCG/ACT AERS respimat Inhale 1 puff into the lungs 4 (four) times daily as needed.      . metFORMIN (GLUCOPHAGE) 1000 MG tablet Take 1,000 mg by mouth 2 (two) times daily with a meal.        . metoprolol (LOPRESSOR) 50 MG tablet Take 75 mg by mouth 2 (two) times daily.      . multivitamin-lutein (OCUVITE-LUTEIN) CAPS Take 1 capsule by mouth daily.      . nitroGLYCERIN (NITROSTAT) 0.4 MG SL tablet Place 0.4 mg under the tongue every 5 (five) minutes as needed.      Marland Kitchen omeprazole (PRILOSEC) 20 MG capsule Take 20 mg by mouth daily.        . prednisoLONE acetate (  PRED FORTE) 1 % ophthalmic suspension Place 1 drop into the left eye daily.      . Sodium Chloride-Sodium Bicarb (SINUS WASH NETI POT NA) Place into the nose 2 (two) times daily as needed.      . triamcinolone cream (KENALOG) 0.1 % Apply topically 2 (two) times daily as needed.        . vitamin B-12 (CYANOCOBALAMIN) 1000 MCG tablet Take 1,000 mcg by mouth daily.        . vitamin C (ASCORBIC ACID) 500 MG tablet Take 1,000 mg by mouth daily.       . vitamin E 400 UNIT capsule Take 400 Units by mouth daily.        . [DISCONTINUED] azithromycin (ZITHROMAX) 250 MG tablet Take two once then one daily until gone  10 tablet  0  . [DISCONTINUED] cefUROXime (CEFTIN) 500 MG tablet Take 1 tablet (500 mg total) by mouth 2 (two) times daily.  20 tablet  0  . [DISCONTINUED] fish oil-omega-3 fatty acids 1000 MG capsule HOLD for NOW      . [DISCONTINUED] predniSONE (DELTASONE) 10 MG tablet Take 4 for two days three for two days two for two days one for two days  20 tablet  0  Last reviewed on 08/17/2012  9:55 AM by Storm Frisk,  MD    Review of Systems  Constitutional: Positive for activity change. Negative for diaphoresis, appetite change, fatigue and unexpected weight change.  HENT: Positive for congestion and sneezing. Negative for hearing loss, nosebleeds, facial swelling, mouth sores, trouble swallowing, neck pain, neck stiffness, dental problem, voice change, sinus pressure, tinnitus and ear discharge.   Eyes: Positive for itching. Negative for photophobia, discharge and visual disturbance.  Respiratory: Negative for apnea, choking, chest tightness and stridor.   Cardiovascular: Negative for palpitations and leg swelling.  Gastrointestinal: Negative for nausea, vomiting, abdominal pain, constipation, blood in stool and abdominal distention.  Genitourinary: Negative for dysuria, urgency, frequency, hematuria, flank pain, decreased urine volume and difficulty urinating.  Musculoskeletal: Negative for back pain, joint swelling, arthralgias and gait problem.  Skin: Negative for color change and pallor.  Neurological: Positive for dizziness and light-headedness. Negative for tremors, seizures, syncope, speech difficulty, weakness and numbness.  Hematological: Negative for adenopathy. Does not bruise/bleed easily.  Psychiatric/Behavioral: Negative for confusion, sleep disturbance and agitation. The patient is not nervous/anxious.        Objective:   Physical Exam  Filed Vitals:   08/17/12 0933  BP: 110/76  Pulse: 64  Temp: 98.3 F (36.8 C)  TempSrc: Oral  Height: 5\' 10"  (1.778 m)  Weight: 227 lb (102.967 kg)  SpO2: 98%    Gen: Pleasant, well-nourished, in no distress,  normal affect  ENT: No lesions,  mouth clear,  oropharynx clear, ++ postnasal drip, no nasal purulence  Neck: No JVD, no TMG, no carotid bruits  Lungs: No use of accessory muscles, no dullness to percussion, clear without rales or rhonchi  Cardiovascular: RRR, heart sounds normal, no murmur or gallops, no peripheral edema  Abdomen:  soft and NT, no HSM,  BS normal  Musculoskeletal: No deformities, no cyanosis or clubbing  Neuro: alert, non focal  Skin: Warm, no lesions or rashes      Assessment & Plan:   Chronic sinusitis Chronic sinusitis now improved with associated cyclical cough also improved Plan Continue Flonase daily along with sinus rinse No additional antibiotics indicated Sugar-free lozenge to be continued for throat moisturizer  Return as needed  Updated Medication List Outpatient Encounter Prescriptions as of 08/17/2012  Medication Sig Dispense Refill  . aspirin 81 MG tablet Take 81 mg by mouth 2 (two) times daily.      Marland Kitchen atorvastatin (LIPITOR) 80 MG tablet Take 40 mg by mouth daily.      . cetirizine (ZYRTEC) 10 MG chewable tablet Chew 10 mg by mouth daily.      . fluticasone (FLONASE) 50 MCG/ACT nasal spray Place 2 sprays into the nose 2 (two) times daily.      . hydrochlorothiazide (HYDRODIURIL) 25 MG tablet Take 25 mg by mouth daily.        . Ipratropium-Albuterol (COMBIVENT RESPIMAT) 20-100 MCG/ACT AERS respimat Inhale 1 puff into the lungs 4 (four) times daily as needed.      . metFORMIN (GLUCOPHAGE) 1000 MG tablet Take 1,000 mg by mouth 2 (two) times daily with a meal.        . metoprolol (LOPRESSOR) 50 MG tablet Take 75 mg by mouth 2 (two) times daily.      . multivitamin-lutein (OCUVITE-LUTEIN) CAPS Take 1 capsule by mouth daily.      . nitroGLYCERIN (NITROSTAT) 0.4 MG SL tablet Place 0.4 mg under the tongue every 5 (five) minutes as needed.      Marland Kitchen omeprazole (PRILOSEC) 20 MG capsule Take 20 mg by mouth daily.        . prednisoLONE acetate (PRED FORTE) 1 % ophthalmic suspension Place 1 drop into the left eye daily.      . Sodium Chloride-Sodium Bicarb (SINUS WASH NETI POT NA) Place into the nose 2 (two) times daily as needed.      . triamcinolone cream (KENALOG) 0.1 % Apply topically 2 (two) times daily as needed.        . vitamin B-12 (CYANOCOBALAMIN) 1000 MCG tablet Take 1,000 mcg  by mouth daily.        . vitamin C (ASCORBIC ACID) 500 MG tablet Take 1,000 mg by mouth daily.       . vitamin E 400 UNIT capsule Take 400 Units by mouth daily.        . fish oil-omega-3 fatty acids 1000 MG capsule One daily, chilled      . [DISCONTINUED] azithromycin (ZITHROMAX) 250 MG tablet Take two once then one daily until gone  10 tablet  0  . [DISCONTINUED] cefUROXime (CEFTIN) 500 MG tablet Take 1 tablet (500 mg total) by mouth 2 (two) times daily.  20 tablet  0  . [DISCONTINUED] fish oil-omega-3 fatty acids 1000 MG capsule HOLD for NOW      . [DISCONTINUED] predniSONE (DELTASONE) 10 MG tablet Take 4 for two days three for two days two for two days one for two days  20 tablet  0

## 2012-10-13 ENCOUNTER — Other Ambulatory Visit (HOSPITAL_COMMUNITY): Payer: Self-pay | Admitting: Cardiovascular Disease

## 2012-10-13 DIAGNOSIS — R011 Cardiac murmur, unspecified: Secondary | ICD-10-CM

## 2012-10-13 DIAGNOSIS — R0989 Other specified symptoms and signs involving the circulatory and respiratory systems: Secondary | ICD-10-CM

## 2012-10-31 ENCOUNTER — Ambulatory Visit (HOSPITAL_COMMUNITY)
Admission: RE | Admit: 2012-10-31 | Discharge: 2012-10-31 | Disposition: A | Discharge status: Home / Self Care | Payer: Medicare Other | Source: Ambulatory Visit | Attending: Cardiovascular Disease | Admitting: Cardiovascular Disease

## 2012-10-31 DIAGNOSIS — I252 Old myocardial infarction: Secondary | ICD-10-CM | POA: Insufficient documentation

## 2012-10-31 DIAGNOSIS — J449 Chronic obstructive pulmonary disease, unspecified: Secondary | ICD-10-CM | POA: Insufficient documentation

## 2012-10-31 DIAGNOSIS — I059 Rheumatic mitral valve disease, unspecified: Secondary | ICD-10-CM | POA: Insufficient documentation

## 2012-10-31 DIAGNOSIS — I079 Rheumatic tricuspid valve disease, unspecified: Secondary | ICD-10-CM | POA: Insufficient documentation

## 2012-10-31 DIAGNOSIS — I359 Nonrheumatic aortic valve disorder, unspecified: Secondary | ICD-10-CM | POA: Insufficient documentation

## 2012-10-31 DIAGNOSIS — R0989 Other specified symptoms and signs involving the circulatory and respiratory systems: Secondary | ICD-10-CM

## 2012-10-31 DIAGNOSIS — R011 Cardiac murmur, unspecified: Secondary | ICD-10-CM | POA: Insufficient documentation

## 2012-10-31 DIAGNOSIS — J4489 Other specified chronic obstructive pulmonary disease: Secondary | ICD-10-CM | POA: Insufficient documentation

## 2012-10-31 NOTE — Progress Notes (Signed)
Carotid artery duplex doppler was completed. Meryem Haertel RVT 

## 2012-10-31 NOTE — Progress Notes (Signed)
[facility]   2D echo completed 10/31/2012.   Cindy Lotus Santillo, RDCS  

## 2012-11-11 ENCOUNTER — Encounter (HOSPITAL_COMMUNITY): Payer: Self-pay | Admitting: Emergency Medicine

## 2012-11-11 ENCOUNTER — Emergency Department (HOSPITAL_COMMUNITY): Payer: Medicare Other

## 2012-11-11 ENCOUNTER — Inpatient Hospital Stay (HOSPITAL_COMMUNITY)
Admission: EM | Admit: 2012-11-11 | Discharge: 2012-11-17 | DRG: 871 | Disposition: A | Discharge status: Home / Self Care | Payer: Medicare Other | Attending: Internal Medicine | Admitting: Internal Medicine

## 2012-11-11 DIAGNOSIS — Z9849 Cataract extraction status, unspecified eye: Secondary | ICD-10-CM

## 2012-11-11 DIAGNOSIS — E669 Obesity, unspecified: Secondary | ICD-10-CM

## 2012-11-11 DIAGNOSIS — I25810 Atherosclerosis of coronary artery bypass graft(s) without angina pectoris: Secondary | ICD-10-CM | POA: Diagnosis present

## 2012-11-11 DIAGNOSIS — R7989 Other specified abnormal findings of blood chemistry: Secondary | ICD-10-CM | POA: Diagnosis present

## 2012-11-11 DIAGNOSIS — I10 Essential (primary) hypertension: Secondary | ICD-10-CM | POA: Diagnosis present

## 2012-11-11 DIAGNOSIS — I2510 Atherosclerotic heart disease of native coronary artery without angina pectoris: Secondary | ICD-10-CM | POA: Diagnosis present

## 2012-11-11 DIAGNOSIS — J449 Chronic obstructive pulmonary disease, unspecified: Secondary | ICD-10-CM | POA: Diagnosis present

## 2012-11-11 DIAGNOSIS — R931 Abnormal findings on diagnostic imaging of heart and coronary circulation: Secondary | ICD-10-CM | POA: Diagnosis present

## 2012-11-11 DIAGNOSIS — I248 Other forms of acute ischemic heart disease: Secondary | ICD-10-CM | POA: Diagnosis present

## 2012-11-11 DIAGNOSIS — G4733 Obstructive sleep apnea (adult) (pediatric): Secondary | ICD-10-CM | POA: Diagnosis present

## 2012-11-11 DIAGNOSIS — J329 Chronic sinusitis, unspecified: Secondary | ICD-10-CM

## 2012-11-11 DIAGNOSIS — E118 Type 2 diabetes mellitus with unspecified complications: Secondary | ICD-10-CM

## 2012-11-11 DIAGNOSIS — J9819 Other pulmonary collapse: Secondary | ICD-10-CM | POA: Diagnosis present

## 2012-11-11 DIAGNOSIS — E785 Hyperlipidemia, unspecified: Secondary | ICD-10-CM | POA: Diagnosis present

## 2012-11-11 DIAGNOSIS — I252 Old myocardial infarction: Secondary | ICD-10-CM

## 2012-11-11 DIAGNOSIS — R739 Hyperglycemia, unspecified: Secondary | ICD-10-CM | POA: Diagnosis present

## 2012-11-11 DIAGNOSIS — Z9861 Coronary angioplasty status: Secondary | ICD-10-CM

## 2012-11-11 DIAGNOSIS — G9341 Metabolic encephalopathy: Secondary | ICD-10-CM | POA: Diagnosis present

## 2012-11-11 DIAGNOSIS — E860 Dehydration: Secondary | ICD-10-CM | POA: Diagnosis present

## 2012-11-11 DIAGNOSIS — Z800 Family history of malignant neoplasm of digestive organs: Secondary | ICD-10-CM

## 2012-11-11 DIAGNOSIS — L02419 Cutaneous abscess of limb, unspecified: Secondary | ICD-10-CM | POA: Diagnosis present

## 2012-11-11 DIAGNOSIS — A419 Sepsis, unspecified organism: Principal | ICD-10-CM | POA: Diagnosis present

## 2012-11-11 DIAGNOSIS — Q618 Other cystic kidney diseases: Secondary | ICD-10-CM

## 2012-11-11 DIAGNOSIS — J189 Pneumonia, unspecified organism: Secondary | ICD-10-CM

## 2012-11-11 DIAGNOSIS — IMO0002 Reserved for concepts with insufficient information to code with codable children: Secondary | ICD-10-CM | POA: Diagnosis present

## 2012-11-11 DIAGNOSIS — R6521 Severe sepsis with septic shock: Secondary | ICD-10-CM

## 2012-11-11 DIAGNOSIS — R509 Fever, unspecified: Secondary | ICD-10-CM

## 2012-11-11 DIAGNOSIS — E872 Acidosis, unspecified: Secondary | ICD-10-CM | POA: Diagnosis present

## 2012-11-11 DIAGNOSIS — Z79899 Other long term (current) drug therapy: Secondary | ICD-10-CM

## 2012-11-11 DIAGNOSIS — I2489 Other forms of acute ischemic heart disease: Secondary | ICD-10-CM | POA: Diagnosis present

## 2012-11-11 DIAGNOSIS — I219 Acute myocardial infarction, unspecified: Secondary | ICD-10-CM

## 2012-11-11 DIAGNOSIS — K219 Gastro-esophageal reflux disease without esophagitis: Secondary | ICD-10-CM | POA: Diagnosis present

## 2012-11-11 DIAGNOSIS — Z7982 Long term (current) use of aspirin: Secondary | ICD-10-CM

## 2012-11-11 DIAGNOSIS — N179 Acute kidney failure, unspecified: Secondary | ICD-10-CM | POA: Diagnosis present

## 2012-11-11 DIAGNOSIS — E876 Hypokalemia: Secondary | ICD-10-CM | POA: Diagnosis present

## 2012-11-11 DIAGNOSIS — R6520 Severe sepsis without septic shock: Secondary | ICD-10-CM | POA: Diagnosis present

## 2012-11-11 DIAGNOSIS — Z87891 Personal history of nicotine dependence: Secondary | ICD-10-CM

## 2012-11-11 DIAGNOSIS — J4489 Other specified chronic obstructive pulmonary disease: Secondary | ICD-10-CM | POA: Diagnosis present

## 2012-11-11 DIAGNOSIS — E1165 Type 2 diabetes mellitus with hyperglycemia: Secondary | ICD-10-CM | POA: Diagnosis present

## 2012-11-11 LAB — COMPREHENSIVE METABOLIC PANEL
ALT: 34 U/L (ref 0–53)
Albumin: 3.9 g/dL (ref 3.5–5.2)
Alkaline Phosphatase: 57 U/L (ref 39–117)
BUN: 35 mg/dL — ABNORMAL HIGH (ref 6–23)
Calcium: 9.4 mg/dL (ref 8.4–10.5)
Potassium: 3.1 mEq/L — ABNORMAL LOW (ref 3.5–5.1)
Sodium: 140 mEq/L (ref 135–145)
Total Protein: 7.5 g/dL (ref 6.0–8.3)

## 2012-11-11 LAB — CBC WITH DIFFERENTIAL/PLATELET
Basophils Relative: 0 % (ref 0–1)
Eosinophils Absolute: 0.1 10*3/uL (ref 0.0–0.7)
Eosinophils Relative: 1 % (ref 0–5)
MCH: 30.3 pg (ref 26.0–34.0)
MCHC: 35 g/dL (ref 30.0–36.0)
MCV: 86.5 fL (ref 78.0–100.0)
Neutrophils Relative %: 88 % — ABNORMAL HIGH (ref 43–77)
Platelets: 196 10*3/uL (ref 150–400)

## 2012-11-11 LAB — TROPONIN I: Troponin I: 0.4 ng/mL (ref <0.30)

## 2012-11-11 MED ORDER — SODIUM CHLORIDE 0.9 % IV SOLN
1000.0000 mL | Freq: Once | INTRAVENOUS | Status: AC
  Administered 2012-11-11: 1000 mL via INTRAVENOUS

## 2012-11-11 MED ORDER — SODIUM CHLORIDE 0.9 % IV BOLUS (SEPSIS)
1000.0000 mL | Freq: Once | INTRAVENOUS | Status: AC
  Administered 2012-11-11: 1000 mL via INTRAVENOUS

## 2012-11-11 MED ORDER — SODIUM CHLORIDE 0.9 % IV SOLN
1000.0000 mL | INTRAVENOUS | Status: DC
  Administered 2012-11-11 – 2012-11-12 (×2): 1000 mL via INTRAVENOUS

## 2012-11-11 MED ORDER — DEXTROSE 5 % IV SOLN
500.0000 mg | Freq: Once | INTRAVENOUS | Status: AC
  Administered 2012-11-11: 500 mg via INTRAVENOUS
  Filled 2012-11-11 (×2): qty 500

## 2012-11-11 MED ORDER — CEFTRIAXONE SODIUM 1 G IJ SOLR
1.0000 g | Freq: Once | INTRAMUSCULAR | Status: AC
Start: 2012-11-11 — End: 2012-11-11
  Administered 2012-11-11: 1 g via INTRAVENOUS
  Filled 2012-11-11 (×2): qty 10

## 2012-11-11 MED ORDER — ACETAMINOPHEN 325 MG PO TABS
650.0000 mg | ORAL_TABLET | Freq: Once | ORAL | Status: AC
  Administered 2012-11-11: 650 mg via ORAL
  Filled 2012-11-11: qty 2

## 2012-11-11 MED ORDER — VANCOMYCIN HCL IN DEXTROSE 1-5 GM/200ML-% IV SOLN
1000.0000 mg | Freq: Once | INTRAVENOUS | Status: AC
  Administered 2012-11-12: 1000 mg via INTRAVENOUS
  Filled 2012-11-11: qty 200

## 2012-11-11 MED ORDER — SODIUM CHLORIDE 0.9 % IV BOLUS (SEPSIS)
1000.0000 mL | Freq: Once | INTRAVENOUS | Status: AC
  Administered 2012-11-12: 1000 mL via INTRAVENOUS

## 2012-11-11 NOTE — ED Notes (Signed)
Pt had procedure to lift his eye lids and eye brows on Tuesday. Pt was recovering well up until approx. 2 hours ago. States he started having generalized aches and pain throughout his body. Patient febrile and tachycardic. Area above eye is warm to touch, pt denies pain. Bruising to face from procedure noted. Currently Ax4, NAD. Pt reports he did not receive antibiotics before or after the procedure.

## 2012-11-11 NOTE — ED Notes (Addendum)
EDP made aware of critical Troponin and low BP

## 2012-11-12 ENCOUNTER — Inpatient Hospital Stay (HOSPITAL_COMMUNITY): Payer: Medicare Other

## 2012-11-12 DIAGNOSIS — A419 Sepsis, unspecified organism: Secondary | ICD-10-CM

## 2012-11-12 DIAGNOSIS — R6521 Severe sepsis with septic shock: Secondary | ICD-10-CM | POA: Diagnosis present

## 2012-11-12 DIAGNOSIS — E876 Hypokalemia: Secondary | ICD-10-CM | POA: Diagnosis present

## 2012-11-12 DIAGNOSIS — N179 Acute kidney failure, unspecified: Secondary | ICD-10-CM | POA: Diagnosis present

## 2012-11-12 DIAGNOSIS — R739 Hyperglycemia, unspecified: Secondary | ICD-10-CM | POA: Diagnosis present

## 2012-11-12 DIAGNOSIS — J9819 Other pulmonary collapse: Secondary | ICD-10-CM

## 2012-11-12 LAB — PROCALCITONIN: Procalcitonin: 42.4 ng/mL

## 2012-11-12 LAB — TROPONIN I
Troponin I: 0.4 ng/mL (ref <0.30)
Troponin I: 0.46 ng/mL (ref <0.30)

## 2012-11-12 LAB — URINALYSIS, ROUTINE W REFLEX MICROSCOPIC
Glucose, UA: NEGATIVE mg/dL
Ketones, ur: NEGATIVE mg/dL
Leukocytes, UA: NEGATIVE
Nitrite: NEGATIVE
Specific Gravity, Urine: 1.023 (ref 1.005–1.030)
pH: 5 (ref 5.0–8.0)

## 2012-11-12 LAB — FIBRINOGEN: Fibrinogen: 369 mg/dL (ref 204–475)

## 2012-11-12 LAB — HEMOGLOBIN A1C: Mean Plasma Glucose: 131 mg/dL — ABNORMAL HIGH (ref <117)

## 2012-11-12 LAB — GLUCOSE, CAPILLARY
Glucose-Capillary: 145 mg/dL — ABNORMAL HIGH (ref 70–99)
Glucose-Capillary: 170 mg/dL — ABNORMAL HIGH (ref 70–99)
Glucose-Capillary: 133 mg/dL — ABNORMAL HIGH (ref 70–99)

## 2012-11-12 LAB — BASIC METABOLIC PANEL
BUN: 36 mg/dL — ABNORMAL HIGH (ref 6–23)
Creatinine, Ser: 2.04 mg/dL — ABNORMAL HIGH (ref 0.50–1.35)
GFR calc non Af Amer: 30 mL/min — ABNORMAL LOW (ref >90)
Glucose, Bld: 157 mg/dL — ABNORMAL HIGH (ref 70–99)
Potassium: 3.6 mEq/L (ref 3.5–5.1)

## 2012-11-12 LAB — CBC
HCT: 38.5 % — ABNORMAL LOW (ref 39.0–52.0)
HCT: 37.9 % — ABNORMAL LOW (ref 39.0–52.0)
Hemoglobin: 13.1 g/dL (ref 13.0–17.0)
Hemoglobin: 13.1 g/dL (ref 13.0–17.0)
MCH: 29.7 pg (ref 26.0–34.0)
MCHC: 34 g/dL (ref 30.0–36.0)
MCHC: 34.6 g/dL (ref 30.0–36.0)
MCV: 86.3 fL (ref 78.0–100.0)
RDW: 14.6 % (ref 11.5–15.5)

## 2012-11-12 LAB — COMPREHENSIVE METABOLIC PANEL
Alkaline Phosphatase: 54 U/L (ref 39–117)
BUN: 28 mg/dL — ABNORMAL HIGH (ref 6–23)
Calcium: 8 mg/dL — ABNORMAL LOW (ref 8.4–10.5)
GFR calc Af Amer: 50 mL/min — ABNORMAL LOW (ref >90)
GFR calc non Af Amer: 43 mL/min — ABNORMAL LOW (ref >90)
Glucose, Bld: 127 mg/dL — ABNORMAL HIGH (ref 70–99)
Total Protein: 6.2 g/dL (ref 6.0–8.3)

## 2012-11-12 LAB — MAGNESIUM: Magnesium: 2 mg/dL (ref 1.5–2.5)

## 2012-11-12 LAB — LACTIC ACID, PLASMA
Lactic Acid, Venous: 7.3 mmol/L — ABNORMAL HIGH (ref 0.5–2.2)
Lactic Acid, Venous: 5.1 mmol/L — ABNORMAL HIGH (ref 0.5–2.2)
Lactic Acid, Venous: 2.2 mmol/L (ref 0.5–2.2)

## 2012-11-12 LAB — TYPE AND SCREEN
ABO/RH(D): O POS
Antibody Screen: NEGATIVE

## 2012-11-12 LAB — CORTISOL: Cortisol, Plasma: 28.7 ug/dL

## 2012-11-12 LAB — APTT: aPTT: 34 seconds (ref 24–37)

## 2012-11-12 LAB — PROTIME-INR: Prothrombin Time: 15.2 seconds (ref 11.6–15.2)

## 2012-11-12 MED ORDER — SODIUM GLYCEROPHOSPHATE 1 MMOLE/ML IV SOLN
10.0000 mmol | Freq: Once | INTRAVENOUS | Status: AC
  Administered 2012-11-12: 10 mmol via INTRAVENOUS
  Filled 2012-11-12: qty 10

## 2012-11-12 MED ORDER — VANCOMYCIN HCL IN DEXTROSE 1-5 GM/200ML-% IV SOLN
1000.0000 mg | INTRAVENOUS | Status: DC
  Administered 2012-11-12: 1000 mg via INTRAVENOUS
  Filled 2012-11-12 (×2): qty 200

## 2012-11-12 MED ORDER — SODIUM CHLORIDE 0.9 % IV BOLUS (SEPSIS)
500.0000 mL | Freq: Once | INTRAVENOUS | Status: AC
  Administered 2012-11-12: 500 mL via INTRAVENOUS

## 2012-11-12 MED ORDER — ASPIRIN EC 81 MG PO TBEC
81.0000 mg | DELAYED_RELEASE_TABLET | Freq: Two times a day (BID) | ORAL | Status: DC
  Administered 2012-11-12 – 2012-11-17 (×11): 81 mg via ORAL
  Filled 2012-11-12 (×13): qty 1

## 2012-11-12 MED ORDER — PANTOPRAZOLE SODIUM 40 MG PO TBEC
40.0000 mg | DELAYED_RELEASE_TABLET | Freq: Every day | ORAL | Status: DC
  Administered 2012-11-12 – 2012-11-17 (×6): 40 mg via ORAL
  Filled 2012-11-12 (×6): qty 1

## 2012-11-12 MED ORDER — SODIUM CHLORIDE 0.9 % IV SOLN: 1000.0000 mL | INTRAVENOUS | Status: DC

## 2012-11-12 MED ORDER — VANCOMYCIN HCL IN DEXTROSE 1-5 GM/200ML-% IV SOLN
1000.0000 mg | Freq: Once | INTRAVENOUS | Status: AC
  Administered 2012-11-12: 1000 mg via INTRAVENOUS
  Filled 2012-11-12: qty 200

## 2012-11-12 MED ORDER — HEPARIN SODIUM (PORCINE) 5000 UNIT/ML IJ SOLN
5000.0000 [IU] | Freq: Three times a day (TID) | INTRAMUSCULAR | Status: DC
  Administered 2012-11-12 – 2012-11-17 (×16): 5000 [IU] via SUBCUTANEOUS
  Filled 2012-11-12 (×18): qty 1

## 2012-11-12 MED ORDER — PIPERACILLIN-TAZOBACTAM 3.375 G IVPB
3.3750 g | Freq: Three times a day (TID) | INTRAVENOUS | Status: DC
  Administered 2012-11-12 – 2012-11-17 (×15): 3.375 g via INTRAVENOUS
  Filled 2012-11-12 (×18): qty 50

## 2012-11-12 MED ORDER — PREDNISOLONE ACETATE 1 % OP SUSP
1.0000 [drp] | Freq: Every day | OPHTHALMIC | Status: DC
  Administered 2012-11-12 – 2012-11-17 (×6): 1 [drp] via OPHTHALMIC
  Filled 2012-11-12: qty 1

## 2012-11-12 MED ORDER — SODIUM GLYCEROPHOSPHATE 1 MMOLE/ML IV SOLN
20.0000 mmol | Freq: Once | INTRAVENOUS | Status: AC
  Administered 2012-11-12: 20 mmol via INTRAVENOUS
  Filled 2012-11-12: qty 20

## 2012-11-12 MED ORDER — SODIUM PHOSPHATE 3 MMOLE/ML IV SOLN: 30.0000 mmol | Freq: Once | INTRAVENOUS | Status: DC

## 2012-11-12 MED ORDER — NEOMYCIN-BACITRACIN ZN-POLYMYX 5-400-10000 OP OINT: 1.0000 "application " | TOPICAL_OINTMENT | Freq: Two times a day (BID) | OPHTHALMIC | Status: DC

## 2012-11-12 MED ORDER — FISH OIL 1200 MG PO CPDR: 1200.0000 mg | DELAYED_RELEASE_CAPSULE | Freq: Every day | ORAL | Status: DC

## 2012-11-12 MED ORDER — ASPIRIN 81 MG PO CHEW
CHEWABLE_TABLET | ORAL | Status: AC
  Administered 2012-11-12: 81 mg
  Filled 2012-11-12: qty 1

## 2012-11-12 MED ORDER — IPRATROPIUM-ALBUTEROL 20-100 MCG/ACT IN AERS: 1.0000 | INHALATION_SPRAY | Freq: Four times a day (QID) | RESPIRATORY_TRACT | Status: DC | PRN

## 2012-11-12 MED ORDER — PIPERACILLIN-TAZOBACTAM 3.375 G IVPB 30 MIN
3.3750 g | Freq: Once | INTRAVENOUS | Status: AC
  Administered 2012-11-12: 3.375 g via INTRAVENOUS
  Filled 2012-11-12: qty 50

## 2012-11-12 MED ORDER — OMEGA-3-ACID ETHYL ESTERS 1 G PO CAPS
1.0000 g | ORAL_CAPSULE | Freq: Every day | ORAL | Status: DC
  Administered 2012-11-12 – 2012-11-17 (×6): 1 g via ORAL
  Filled 2012-11-12 (×6): qty 1

## 2012-11-12 MED ORDER — VITAMIN D3 25 MCG (1000 UNIT) PO TABS
2000.0000 [IU] | ORAL_TABLET | Freq: Every day | ORAL | Status: DC
  Administered 2012-11-12 – 2012-11-17 (×6): 2000 [IU] via ORAL
  Filled 2012-11-12 (×6): qty 2

## 2012-11-12 MED ORDER — BACITRACIN-POLYMYXIN B 500-10000 UNIT/GM OP OINT
TOPICAL_OINTMENT | Freq: Two times a day (BID) | OPHTHALMIC | Status: DC
  Administered 2012-11-12 – 2012-11-17 (×11): via OPHTHALMIC
  Filled 2012-11-12 (×2): qty 3.5

## 2012-11-12 MED ORDER — B COMPLEX-C PO TABS
1.0000 | ORAL_TABLET | Freq: Every day | ORAL | Status: DC
  Administered 2012-11-12 – 2012-11-17 (×6): 1 via ORAL
  Filled 2012-11-12 (×6): qty 1

## 2012-11-12 MED ORDER — MAGNESIUM SULFATE 40 MG/ML IJ SOLN
4.0000 g | Freq: Once | INTRAMUSCULAR | Status: AC
  Administered 2012-11-12: 4 g via INTRAVENOUS
  Filled 2012-11-12: qty 100

## 2012-11-12 MED ORDER — OCUVITE-LUTEIN PO CAPS
1.0000 | ORAL_CAPSULE | Freq: Every day | ORAL | Status: DC
  Administered 2012-11-12 – 2012-11-17 (×6): 1 via ORAL
  Filled 2012-11-12 (×6): qty 1

## 2012-11-12 MED ORDER — POTASSIUM CHLORIDE CRYS ER 20 MEQ PO TBCR
40.0000 meq | EXTENDED_RELEASE_TABLET | Freq: Once | ORAL | Status: AC
  Administered 2012-11-12: 40 meq via ORAL
  Filled 2012-11-12: qty 2

## 2012-11-12 MED ORDER — WHITE PETROLATUM GEL
Status: AC
  Filled 2012-11-12: qty 5

## 2012-11-12 MED ORDER — SODIUM CHLORIDE 0.9 % IV SOLN: 250.0000 mL | INTRAVENOUS | Status: DC | PRN

## 2012-11-12 MED ORDER — VITAMIN E 180 MG (400 UNIT) PO CAPS
800.0000 [IU] | ORAL_CAPSULE | Freq: Every day | ORAL | Status: DC
  Administered 2012-11-12 – 2012-11-17 (×6): 800 [IU] via ORAL
  Filled 2012-11-12 (×6): qty 2

## 2012-11-12 MED ORDER — FLUTICASONE PROPIONATE 50 MCG/ACT NA SUSP
2.0000 | Freq: Two times a day (BID) | NASAL | Status: DC
  Administered 2012-11-12 – 2012-11-17 (×8): 2 via NASAL
  Filled 2012-11-12: qty 16

## 2012-11-12 MED ORDER — ACETAMINOPHEN 325 MG PO TABS
975.0000 mg | ORAL_TABLET | Freq: Four times a day (QID) | ORAL | Status: DC | PRN
  Administered 2012-11-15 – 2012-11-17 (×6): 975 mg via ORAL
  Filled 2012-11-12 (×3): qty 3
  Filled 2012-11-12: qty 1
  Filled 2012-11-12: qty 2
  Filled 2012-11-12 (×2): qty 3

## 2012-11-12 MED ORDER — TRIAMCINOLONE ACETONIDE 0.1 % EX CREA
1.0000 "application " | TOPICAL_CREAM | Freq: Two times a day (BID) | CUTANEOUS | Status: DC
  Administered 2012-11-12 – 2012-11-17 (×10): 1 via TOPICAL
  Filled 2012-11-12: qty 15

## 2012-11-12 MED ORDER — INSULIN ASPART 100 UNIT/ML ~~LOC~~ SOLN
0.0000 [IU] | SUBCUTANEOUS | Status: DC
  Administered 2012-11-12: 2 [IU] via SUBCUTANEOUS
  Administered 2012-11-12: 1 [IU] via SUBCUTANEOUS
  Administered 2012-11-12 – 2012-11-13 (×6): 2 [IU] via SUBCUTANEOUS
  Administered 2012-11-13 – 2012-11-14 (×5): 1 [IU] via SUBCUTANEOUS
  Administered 2012-11-14: 13:00:00 via SUBCUTANEOUS

## 2012-11-12 MED ORDER — VITAMIN C 500 MG PO TABS
1000.0000 mg | ORAL_TABLET | Freq: Every day | ORAL | Status: DC
  Administered 2012-11-12 – 2012-11-17 (×6): 1000 mg via ORAL
  Filled 2012-11-12 (×6): qty 2

## 2012-11-12 MED ORDER — SODIUM CHLORIDE 0.9 % IV BOLUS (SEPSIS): 1000.0000 mL | INTRAVENOUS | Status: DC | PRN

## 2012-11-12 NOTE — Progress Notes (Signed)
eLink Physician-Brief Progress Note Patient Name: Victor Morgan DOB: [birth year] MRN: [id number]  Date of Service  11/12/2012   HPI/Events of Note  Hypophosphatemia and hypomag   eICU Interventions  Phos and Mag replaced   Intervention Category Intermediate Interventions: Electrolyte abnormality - evaluation and management  DETERDING,ELIZABETH 11/12/2012, 5:29 AM

## 2012-11-12 NOTE — ED Notes (Signed)
Respiratory paged to place arterial line per CCMD request

## 2012-11-12 NOTE — H&P (Signed)
PULMONARY  / CRITICAL CARE MEDICINE  Name: Victor Morgan MRN: [id number] DOB: [birth year]    ADMISSION DATE:  11/11/2012   REFERRING MD :  ED PRIMARY SERVICE: PCCM  CHIEF COMPLAINT:  AMS  BRIEF PATIENT DESCRIPTION: 79 year old male presented to ED after wife noted him to be confused at home. Recent facial surgery ( last Tuesday), founf to be hypotensive in ED, with lactic acidosis and AKI.  SIGNIFICANT EVENTS / STUDIES:  11/12/12>>> hypotensive with MAP in 40's, Lactate of 7, AKI  LINES / TUBES: PIV 11/12/12>>  CULTURES: Blood 11/11/12>>> Urine 11/11/12>>>  ANTIBIOTICS: Vancomycin 11/12/12>>> Zosyn 11/12/12>>>  HISTORY OF PRESENT ILLNESS:  79 year old male presents to the ED brought by EMS after wife noticed him to be confused. Per wife, she left her house and came back two hours later to find Victor Morgan confused, no slurred speech, focal weakness noted. She noticed he was warm to touch and called 911. On arrival to the ED he was found to be febrile with T:102 and hypotensive ( MAP 45-50 range), Lactate of 7. He received fluid resuscitation with 4 liters of crystalloid which improved his mentation back to baseline but his MAP remained low.   Victor Morgan underwent eyebrow lift surgery this past Tuesday, reports no complication during or after the procedure. Denies cough, fevers, chills, N/V/D. Only complaint at present time is that he is feeling thirsty. Denies recent travel or sick contacts.  PAST MEDICAL HISTORY :  Past Medical History  Diagnosis Date  . Cough     chronic cough d/t sinusitis  . Diabetes mellitus     type II  . Hyperlipidemia   . Sleep apnea, obstructive     CPAP NIGHTLY  . Myocardial infarction 1997  . Hx of heart artery stent 1997    PTA  AND STENT X2 TO LAD,STENT TO CICUMFLEX  . Obesity, mild   . Hypertension    Past Surgical History  Procedure Laterality Date  . Heart bypass  nov 2012  . Cornea graft      bilateral eyes  . Cataract extraction     bilateral eyes   Prior to Admission medications   Medication Sig Start Date End Date Taking? Authorizing Provider  acetaminophen (TYLENOL) 500 MG tablet Take 1,000 mg by mouth every 6 (six) hours as needed for pain.   Yes Historical Provider, MD  ascorbic acid (VITAMIN C) 1000 MG tablet Take 1,000 mg by mouth daily.   Yes Historical Provider, MD  aspirin 81 MG tablet Take 81 mg by mouth 2 (two) times daily.   Yes Historical Provider, MD  B Complex-C (B-COMPLEX WITH VITAMIN C) tablet Take 1 tablet by mouth daily.   Yes Historical Provider, MD  cetirizine (ZYRTEC) 10 MG chewable tablet Chew 10 mg by mouth daily.   Yes Historical Provider, MD  Cholecalciferol (VITAMIN D) 2000 UNITS tablet Take 2,000 Units by mouth daily.   Yes Historical Provider, MD  fluticasone (FLONASE) 50 MCG/ACT nasal spray Place 2 sprays into the nose 2 (two) times daily.   Yes Historical Provider, MD  hydrochlorothiazide (HYDRODIURIL) 25 MG tablet Take 25 mg by mouth daily.     Yes Historical Provider, MD  Ipratropium-Albuterol (COMBIVENT RESPIMAT) 20-100 MCG/ACT AERS respimat Inhale 1 puff into the lungs 4 (four) times daily as needed for wheezing or shortness of breath.    Yes Historical Provider, MD  metFORMIN (GLUCOPHAGE) 1000 MG tablet Take 1,000 mg by mouth 2 (two) times daily with  a meal.     Yes Historical Provider, MD  metoprolol (LOPRESSOR) 50 MG tablet Take 75 mg by mouth 2 (two) times daily. 05/19/11  Yes Donielle Margaretann Loveless, PA-C  multivitamin-lutein (OCUVITE-LUTEIN) CAPS Take 1 capsule by mouth daily.   Yes Historical Provider, MD  neomycin-bacitracin-polymyxin (POLYSPORIN) ophthalmic ointment Apply 1 application topically 2 (two) times daily. For facial cuts   Yes Historical Provider, MD  nitroGLYCERIN (NITROSTAT) 0.4 MG SL tablet Place 0.4 mg under the tongue every 5 (five) minutes as needed.   Yes Historical Provider, MD  Omega-3 Fatty Acids (FISH OIL) 1200 MG CPDR Take 1,200 mg by mouth daily.   Yes Historical  Provider, MD  omeprazole (PRILOSEC) 20 MG capsule Take 20 mg by mouth daily.     Yes Historical Provider, MD  prednisoLONE acetate (PRED FORTE) 1 % ophthalmic suspension Place 1 drop into the left eye daily.   Yes Historical Provider, MD  rosuvastatin (CRESTOR) 40 MG tablet Take 20 mg by mouth daily.   Yes Historical Provider, MD  sodium chloride (MURO 128) 5 % ophthalmic solution Place 1 drop into the left eye 3 (three) times daily.   Yes Historical Provider, MD  Sodium Chloride-Sodium Bicarb (SINUS WASH NETI POT NA) Place 1 application into the nose 2 (two) times daily as needed (for washing sinuses).    Yes Historical Provider, MD  triamcinolone cream (KENALOG) 0.1 % Apply 1 application topically 2 (two) times daily.    Yes Historical Provider, MD  vitamin E 1000 UNIT capsule Take 1,000 Units by mouth daily.   Yes Historical Provider, MD   No Known Allergies  FAMILY HISTORY:  Family History  Problem Relation Age of Onset  . Colon cancer Mother    SOCIAL HISTORY:  reports that he quit smoking about 16 years ago. His smoking use included Cigarettes. He has a 45 pack-year smoking history. He has never used smokeless tobacco. He reports that he does not drink alcohol. His drug history is not on file.  REVIEW OF SYSTEMS:   Positives in BOLD  Constitutional: Negative for fever, chills, weight loss, malaise/fatigue and diaphoresis.  HENT: Negative for hearing loss, ear pain, nosebleeds, congestion, sore throat, neck pain, tinnitus and ear discharge.  Eyes: Negative for blurred vision, double vision, photophobia, pain, discharge and redness.  Respiratory: cough, hemoptysis, sputum production, shortness of breath, wheezing and no stridor.  Cardiovascular: Negative for chest pain, palpitations, orthopnea, claudication, leg swelling and PND.  Gastrointestinal: Negative for heartburn, nausea, vomiting, abdominal pain, diarrhea, constipation, blood in stool and melena.  Genitourinary: Negative for  dysuria, urgency, frequency, hematuria and flank pain.  Musculoskeletal: Negative for myalgias, back pain, joint pain and falls.  Skin: Negative for itching and rash.  Neurological: Negative for dizziness, tingling, tremors, sensory change, speech change, focal weakness, seizures, loss of consciousness, weakness and headaches.  Endo/Heme/Allergies: Negative for environmental allergies and polydipsia. Does not bruise/bleed easily.   SUBJECTIVE:   VITAL SIGNS: Temp:  [99.9 F (37.7 C)-103.1 F (39.5 C)] 102.2 F (39 C) (05/03 2345) Pulse Rate:  [96-132] 99 (05/04 0115) Resp:  [16-34] 26 (05/04 0115) BP: (85-122)/(43-88) 106/43 mmHg (05/04 0115) SpO2:  [92 %-100 %] 98 % (05/04 0115) HEMODYNAMICS:   VENTILATOR SETTINGS:   INTAKE / OUTPUT: Intake/Output   None     PHYSICAL EXAMINATION: Pleasant, awake and alert, NAD HEENT - Healing surgical incisions across his entire forehead. Ecchymosis surrounding this without erythema drainage or fluctuance. Bilateral periorbital ecchymosis as well as ecchymosis going down  his bilateral cheeks.  no thrush, no post nasal drip no lymphadenopathy Neck: supple CVS- s1s2 nml, no murmur, no JVD RS- clear, no crackles or rhonchi Abd- Obese, soft, non-tender, no organomegaly CNS- GCS 15,  non focal Ext - no clubbing, cyanosis, 1+ bilateral pedal edema  LABS:  Recent Labs Lab 11/11/12 2058 11/11/12 2218 11/11/12 2328  HGB 16.4  --   --   WBC 16.7*  --   --   PLT 196  --   --   NA 140  --   --   K 3.1*  --   --   CL 99  --   --   CO2 20  --   --   GLUCOSE 169*  --   --   BUN 35*  --   --   CREATININE 1.77*  --   --   CALCIUM 9.4  --   --   AST 35  --   --   ALT 34  --   --   ALKPHOS 57  --   --   BILITOT 0.6  --   --   PROT 7.5  --   --   ALBUMIN 3.9  --   --   LATICACIDVEN  --   --  7.3*  TROPONINI  --  0.40*  --    No results found for this basename: GLUCAP,  in the last 168 hours  CXR: Right side atelectasis, poor  inspiratory effort. Prior sternotomy  ASSESSMENT / PLAN:  PULMONARY A:Atelectasis vs PNA P:   -supplemental oxygen -incentive spirometry -ABx -follow cultures  CARDIOVASCULAR A: Hypotension CAD P:  -hypotension likely due to sepsis + dehydration -IV resuscitation -hold metoprolol for now -continue asa   RENAL A:  AKI, pre-renal  P:   -IVF -place foley, monitor urine output and renal function  -avoid nephrotoxic meds and renally dose meds -obtain renal sonogram  GASTROINTESTINAL A:  GERD P:   -continue PPI -cardiac diet  HEMATOLOGIC A:  Leukocytosis with left shift P:  -likely due to SIRS/Sepsis -monitor WBC  INFECTIOUS A:  Sepsis; unclear source P:    Random cortisol level  Lactic acid level x 3 sets every 4 hours  Cardiac enzymes x 3 sets every 4 hours  Transthoracic echocardiogram to evaluate LV function  IV resucistation  If need from vasoactive agents>> start Levophed gtt, titrate for goal MAP > 65  Empirical Zosyn   Empirical Vancomycin   Pharmacy consult to manage antibiotic dosing  ENDOCRINE A:  Hyperglycemia, DM   P:   -held metformin due to elevated lactate and AKI -SSQ insulin -monitor FSBS  NEUROLOGIC A:  Encephalopathy likely due to sepsis and dehydration P:   -mentation back to baseline after hydration -non focal exam -continue to monitor  TODAY'S SUMMARY: sepsis with unclear source maybe lungs, on broad spectrum ABx; also with AKI likely pre renal, being hydrated.   I have personally obtained a history, examined the patient, evaluated laboratory and imaging results, formulated the assessment and plan and placed orders. CRITICAL CARE: The patient is critically ill with multiple organ systems failure and requires high complexity decision making for assessment and support, frequent evaluation and titration of therapies, application of advanced monitoring technologies and extensive interpretation of multiple databases. Critical  Care Time devoted to patient care services described in this note is 30 minutes.   Maisie Fus, MD Pulmonary and Critical Care Medicine [facility] Pager: [phone]  11/12/2012, 1:46 AM

## 2012-11-12 NOTE — ED Notes (Signed)
Critical Care at bedside.  

## 2012-11-12 NOTE — Care Management Note (Addendum)
    Page 1 of 1   11/17/2012     12:02:16 PM   CARE MANAGEMENT NOTE 11/17/2012  Patient:  Victor Morgan, Victor Morgan   Account Number:  [id number]  Date Initiated:  11/12/2012  Documentation initiated by:  [facility]  Subjective/Objective Assessment:   Admitted with hypotension - septic.  Has wife.     Action/Plan:   pt eval- no pt needs.   Anticipated DC Date:  11/17/2012   Anticipated DC Plan:  HOME W HOME HEALTH SERVICES      DC Planning Services  CM consult      Choice offered to / List presented to:             Status of service:  Completed, signed off Medicare Important Message given?   (If response is "NO", the following Medicare IM given date fields will be blank) Date Medicare IM given:   Date Additional Medicare IM given:    Discharge Disposition:  HOME/SELF CARE  Per UR Regulation:  Reviewed for med. necessity/level of care/duration of stay  If discussed at Long Length of Stay Meetings, dates discussed:    Comments:  ContactEverard, [place] [phone]   [phone]  11/17/12 10:30 Letha Cape RN, BSN [phone] patient is for dc today, no needs anticipated pt states pt has no pt needs.  11/15/12 17:00 Letha Cape RN, BSN [phone] patient lives with wife.  Per physical therapy patient has no pt needs. NCM will continue to follow for dc needs.

## 2012-11-12 NOTE — ED Notes (Signed)
Pt A&O x4, family remains at bedside

## 2012-11-12 NOTE — Progress Notes (Signed)
ANTIBIOTIC CONSULT NOTE - INITIAL  Pharmacy Consult for vancomycin, Zosyn  Indication: rule out sepsis  No Known Allergies  Patient Measurements: Height: 5\' 10"  (177.8 cm) Weight: 227 lb 1.2 oz (103 kg) (Per 08/17/12 documentation) IBW/kg (Calculated) : 73  Vital Signs: Temp: 102.2 F (39 C) (05/03 2345) Temp src: Rectal (05/03 2345) BP: 106/43 mmHg (05/04 0115) Pulse Rate: 99 (05/04 0115) Intake/Output from previous day:   Intake/Output from this shift:    Labs:  Recent Labs  11/11/12 2058  WBC 16.7*  HGB 16.4  PLT 196  CREATININE 1.77*   Estimated Creatinine Clearance: 42 ml/min (by C-G formula based on Cr of 1.77). No results found for this basename: VANCOTROUGH, VANCOPEAK, VANCORANDOM, GENTTROUGH, GENTPEAK, GENTRANDOM, TOBRATROUGH, TOBRAPEAK, TOBRARND, AMIKACINPEAK, AMIKACINTROU, AMIKACIN,  in the last 72 hours   Microbiology: No results found for this or any previous visit (from the past 720 hour(s)).  Medical History: Past Medical History  Diagnosis Date  . Cough     chronic cough d/t sinusitis  . Diabetes mellitus     type II  . Hyperlipidemia   . Sleep apnea, obstructive     CPAP NIGHTLY  . Myocardial infarction 1997  . Hx of heart artery stent 1997    PTA  AND STENT X2 TO LAD,STENT TO CICUMFLEX  . Obesity, mild   . Hypertension     Medications:  Scheduled:  . [COMPLETED] sodium chloride  1,000 mL Intravenous Once  . [COMPLETED] acetaminophen  650 mg Oral Once  . aspirin EC  81 mg Oral BID  . [COMPLETED] azithromycin (ZITHROMAX) 500 MG IVPB  500 mg Intravenous Once  . B-complex with vitamin C  1 tablet Oral Daily  . bacitracin-polymyxin b   Both Eyes BID  . [COMPLETED] cefTRIAXone (ROCEPHIN)  IV  1 g Intravenous Once  . cholecalciferol  2,000 Units Oral Daily  . fluticasone  2 spray Each Nare BID  . heparin  5,000 Units Subcutaneous Q8H  . insulin aspart  0-9 Units Subcutaneous Q4H  . multivitamin-lutein  1 capsule Oral Daily  . omega-3 acid  ethyl esters  1 g Oral Daily  . pantoprazole  40 mg Oral Daily  . piperacillin-tazobactam  3.375 g Intravenous Once  . potassium chloride  40 mEq Oral Once  . prednisoLONE acetate  1 drop Left Eye Daily  . [COMPLETED] sodium chloride  1,000 mL Intravenous Once  . [COMPLETED] sodium chloride  1,000 mL Intravenous Once  . sodium chloride  500 mL Intravenous Once  . triamcinolone cream  1 application Topical BID  . [COMPLETED] vancomycin  1,000 mg Intravenous Once  . vancomycin  1,000 mg Intravenous Once  . ascorbic acid  1,000 mg Oral Daily  . vitamin E  800 Units Oral Daily  . white petrolatum      . [DISCONTINUED] Fish Oil  1,200 mg Oral Daily  . [DISCONTINUED] neomycin-bacitracin-polymyxin  1 application Both Eyes BID   Assessment: 79 yo male presented with fever. Pharmacy to manage vancomycin and Zosyn for r/o sepsis.   Goal of Therapy:  Vancomycin trough level 15-20 mcg/ml  Plan:  1. Zosyn 3.375gm IV (30 min infusion) x 1, then 3.375gm IV Q8H (4 hr infusion) 2. Vancomycin 1gm x 1 (total 2gm loading dose), then 1gm IV Q24H.   Emeline Gins 11/12/2012,2:44 AM

## 2012-11-12 NOTE — ED Notes (Addendum)
Dr. Patria Mane at bedside to discuss admission at this time. Family at bedside, pt alert and oriented but states feels tired at this time

## 2012-11-12 NOTE — Progress Notes (Signed)
eLink Physician-Brief Progress Note Patient Name: Victor Morgan DOB: Nov 29, 1932 MRN: [id number]  Date of Service  11/12/2012   HPI/Events of Note  Hypotension in the setting of fever, elevated WBC, lactate.  Source unclear.  Has received 4 liters of NS.   Current BP of 80/55 (60) with HR of 100.  In not acute resp distress.   eICU Interventions  Plan: 500 cc bolus of NS for BP support    Intervention Category Intermediate Interventions: Hypotension - evaluation and management  DETERDING,ELIZABETH 11/12/2012, 2:23 AM

## 2012-11-12 NOTE — ED Provider Notes (Signed)
History     CSN: [id number]  Arrival date & time 11/11/12  2043   First MD Initiated Contact with Patient 11/11/12 2149      Chief Complaint  Patient presents with  . Fever    The history is provided by the patient.   the patient presents to the emergency apartment with chills and riders.  He had an eyebrow lift done 5 days ago the patient reports he was doing well until today when he developed chills and riders at home.  He denies chest pain or shortness of breath.  He continues to have some cough.  He denies new redness or discharge around his surgical incisions.  His surgical incision is still intact.  He denies dysuria.  No nausea vomiting or diarrhea.  No chest pain.  Symptoms are mild to moderate in severity.  Family denies altered mental status.  No other complaints.  Patient is 79 years old.  He is a diabetic.  He has a history of MI  Past Medical History  Diagnosis Date  . Cough     chronic cough d/t sinusitis  . Diabetes mellitus     type II  . Hyperlipidemia   . Sleep apnea, obstructive     CPAP NIGHTLY  . Myocardial infarction 1997  . Hx of heart artery stent 1997    PTA  AND STENT X2 TO LAD,STENT TO CICUMFLEX  . Obesity, mild   . Hypertension     Past Surgical History  Procedure Laterality Date  . Heart bypass  nov 2012  . Cornea graft      bilateral eyes  . Cataract extraction      bilateral eyes    Family History  Problem Relation Age of Onset  . Colon cancer Mother     History  Substance Use Topics  . Smoking status: Former Smoker -- 1.00 packs/day for 45 years    Types: Cigarettes    Quit date: 05/19/1996  . Smokeless tobacco: Never Used  . Alcohol Use: No      Review of Systems  All other systems reviewed and are negative.    Allergies  Review of patient's allergies indicates no known allergies.  Home Medications   Current Outpatient Rx  Name  Route  Sig  Dispense  Refill  . acetaminophen (TYLENOL) 500 MG tablet   Oral   Take  1,000 mg by mouth every 6 (six) hours as needed for pain.         Marland Kitchen ascorbic acid (VITAMIN C) 1000 MG tablet   Oral   Take 1,000 mg by mouth daily.         Marland Kitchen aspirin 81 MG tablet   Oral   Take 81 mg by mouth 2 (two) times daily.         . B Complex-C (B-COMPLEX WITH VITAMIN C) tablet   Oral   Take 1 tablet by mouth daily.         . cetirizine (ZYRTEC) 10 MG chewable tablet   Oral   Chew 10 mg by mouth daily.         . Cholecalciferol (VITAMIN D) 2000 UNITS tablet   Oral   Take 2,000 Units by mouth daily.         . fluticasone (FLONASE) 50 MCG/ACT nasal spray   Nasal   Place 2 sprays into the nose 2 (two) times daily.         . hydrochlorothiazide (HYDRODIURIL) 25 MG tablet  Oral   Take 25 mg by mouth daily.           . Ipratropium-Albuterol (COMBIVENT RESPIMAT) 20-100 MCG/ACT AERS respimat   Inhalation   Inhale 1 puff into the lungs 4 (four) times daily as needed for wheezing or shortness of breath.          . metFORMIN (GLUCOPHAGE) 1000 MG tablet   Oral   Take 1,000 mg by mouth 2 (two) times daily with a meal.           . metoprolol (LOPRESSOR) 50 MG tablet   Oral   Take 75 mg by mouth 2 (two) times daily.         . multivitamin-lutein (OCUVITE-LUTEIN) CAPS   Oral   Take 1 capsule by mouth daily.         Marland Kitchen neomycin-bacitracin-polymyxin (POLYSPORIN) ophthalmic ointment   Apply externally   Apply 1 application topically 2 (two) times daily. For facial cuts         . nitroGLYCERIN (NITROSTAT) 0.4 MG SL tablet   Sublingual   Place 0.4 mg under the tongue every 5 (five) minutes as needed.         . Omega-3 Fatty Acids (FISH OIL) 1200 MG CPDR   Oral   Take 1,200 mg by mouth daily.         Marland Kitchen omeprazole (PRILOSEC) 20 MG capsule   Oral   Take 20 mg by mouth daily.           . prednisoLONE acetate (PRED FORTE) 1 % ophthalmic suspension   Left Eye   Place 1 drop into the left eye daily.         . rosuvastatin (CRESTOR) 40 MG  tablet   Oral   Take 20 mg by mouth daily.         . sodium chloride (MURO 128) 5 % ophthalmic solution   Left Eye   Place 1 drop into the left eye 3 (three) times daily.         . Sodium Chloride-Sodium Bicarb (SINUS WASH NETI POT NA)   Nasal   Place 1 application into the nose 2 (two) times daily as needed (for washing sinuses).          . triamcinolone cream (KENALOG) 0.1 %   Topical   Apply 1 application topically 2 (two) times daily.          . vitamin E 1000 UNIT capsule   Oral   Take 1,000 Units by mouth daily.           BP 92/45  Pulse 105  Temp(Src) 102.2 F (39 C) (Rectal)  Resp 23  SpO2 100%  Physical Exam  Nursing note and vitals reviewed. Constitutional: He is oriented to person, place, and time. He appears well-developed and well-nourished.  HENT:  Head: Normocephalic and atraumatic.  Healing surgical incisions across his entire forehead.  Ecchymosis surrounding this without erythema drainage or fluctuance.  No significant surgical cellulitis noted.  The patient has bilateral periorbital ecchymosis as well as ecchymosis going down his bilateral cheeks.  Eyes: EOM are normal.  Neck: Normal range of motion.  No meningeal signs  Cardiovascular: Regular rhythm, normal heart sounds and intact distal pulses.   Tachycardia  Pulmonary/Chest: Effort normal and breath sounds normal. No respiratory distress.  Abdominal: Soft. He exhibits no distension. There is no tenderness.  Genitourinary: Rectum normal.  Musculoskeletal: Normal range of motion.  Neurological: He is alert and oriented to  person, place, and time.  Skin: Skin is warm and dry. No rash noted.  Psychiatric: He has a normal mood and affect. Judgment normal.    ED Course  Procedures (including critical care time)   Date: 11/12/2012  Rate: 111  Rhythm: Sinus tachycardia QRS Axis: normal  Intervals: normal  ST/T Wave abnormalities: normal  Conduction Disutrbances: none  Narrative  Interpretation:   Old EKG Reviewed: No significant changes noted     CRITICAL CARE Performed by: Lyanne Co Total critical care time: 35 Critical care time was exclusive of separately billable procedures and treating other patients. Critical care was necessary to treat or prevent imminent or life-threatening deterioration. Critical care was time spent personally by me on the following activities: development of treatment plan with patient and/or surrogate as well as nursing, discussions with consultants, evaluation of patient's response to treatment, examination of patient, obtaining history from patient or surrogate, ordering and performing treatments and interventions, ordering and review of laboratory studies, ordering and review of radiographic studies, pulse oximetry and re-evaluation of patient's condition.   Labs Reviewed  CBC WITH DIFFERENTIAL - Abnormal; Notable for the following:    WBC 16.7 (*)    Neutrophils Relative 88 (*)    Neutro Abs 14.8 (*)    Lymphocytes Relative 6 (*)    All other components within normal limits  COMPREHENSIVE METABOLIC PANEL - Abnormal; Notable for the following:    Potassium 3.1 (*)    Glucose, Bld 169 (*)    BUN 35 (*)    Creatinine, Ser 1.77 (*)    GFR calc non Af Amer 35 (*)    GFR calc Af Amer 41 (*)    All other components within normal limits  TROPONIN I - Abnormal; Notable for the following:    Troponin I 0.40 (*)    All other components within normal limits  LACTIC ACID, PLASMA - Abnormal; Notable for the following:    Lactic Acid, Venous 7.3 (*)    All other components within normal limits  CULTURE, BLOOD (ROUTINE X 2)  CULTURE, BLOOD (ROUTINE X 2)  URINE CULTURE  URINALYSIS, ROUTINE W REFLEX MICROSCOPIC   Dg Chest 2 View  11/11/2012  *RADIOLOGY REPORT*  Clinical Data: Body aches, fever  CHEST - 2 VIEW  Comparison: 04/03/2012  Findings: Mild right hemidiaphragm elevation.  Mild bibasilar opacities.  Status post median  sternotomy and CABG.  Heart size upper normal.  Mediastinal contours otherwise within normal range. Hypoaeration with interstitial and vascular crowding.  No pleural effusion or pneumothorax.  No acute osseous finding.  IMPRESSION: Mild bibasilar opacities, favored to reflect atelectasis.   Original Report Authenticated By: Jearld Lesch, M.D.    I personally reviewed the imaging tests through PACS system I reviewed available ER/hospitalization records through the EMR   1.  Septic shock 2.  Renal failure 3.  Lactic acidosis    MDM  This patient has evidence of multiorgan failure.  He has a bump in his troponin.  His EKG shows no ischemic changes.  His acute renal failure.  He was tachycardic on arrival emergency department.  Febrile 203.  Initially this likely may represent community acquired pneumonia given his questionable bibasilar opacities and thus he was given Rocephin and azithromycin.  He may have an alternative source.  Vancomycin added.  The patient has renal failure.  He's unable to urinate at this time.  In and out catheter will be placed.  2 L IV fluid on arrival.  The  patient's had some improvement in his blood pressure  12:28 AM I spoke with the pulmonary critical care who will by with the patient emergency department for what appears to be septic shock with a lactate is 7.3.  His last blood pressure this time was 103 systolic after 2 and half liters of IV fluid      Lyanne Co, MD 11/12/12 [phone]

## 2012-11-12 NOTE — Procedures (Signed)
Arterial Catheter Insertion Procedure Note MALIKAI GUT [id number] 1933-05-08  Procedure: Insertion of Arterial Catheter  Indications: Blood pressure monitoring and Frequent blood sampling  Procedure Details Consent: Risks of procedure as well as the alternatives and risks of each were explained to the (patient/caregiver).  Consent for procedure obtained. Time Out: Verified patient identification, verified procedure, site/side was marked, verified correct patient position, special equipment/implants available, medications/allergies/relevent history reviewed, required imaging and test results available.  Performed  Maximum sterile technique was used including cap, gloves, gown, hand hygiene, mask and sheet. Skin prep: Chlorhexidine; local anesthetic administered 20 gauge catheter was inserted into right radial artery using the Seldinger technique.  Evaluation Blood flow good; BP tracing good. Complications: No apparent complications.  Assisted by Donato Schultz, RRT   Carlynn Spry 11/12/2012

## 2012-11-13 DIAGNOSIS — M79609 Pain in unspecified limb: Secondary | ICD-10-CM

## 2012-11-13 DIAGNOSIS — N179 Acute kidney failure, unspecified: Secondary | ICD-10-CM

## 2012-11-13 DIAGNOSIS — R509 Fever, unspecified: Secondary | ICD-10-CM

## 2012-11-13 DIAGNOSIS — M7989 Other specified soft tissue disorders: Secondary | ICD-10-CM

## 2012-11-13 LAB — URINE CULTURE
Colony Count: NO GROWTH
Colony Count: NO GROWTH

## 2012-11-13 LAB — GLUCOSE, CAPILLARY: Glucose-Capillary: 122 mg/dL — ABNORMAL HIGH (ref 70–99)

## 2012-11-13 LAB — MAGNESIUM: Magnesium: 2 mg/dL (ref 1.5–2.5)

## 2012-11-13 LAB — CBC
Hemoglobin: 12.6 g/dL — ABNORMAL LOW (ref 13.0–17.0)
MCHC: 33.9 g/dL (ref 30.0–36.0)
Platelets: 139 10*3/uL — ABNORMAL LOW (ref 150–400)
RBC: 4.32 MIL/uL (ref 4.22–5.81)

## 2012-11-13 LAB — BASIC METABOLIC PANEL
GFR calc Af Amer: 59 mL/min — ABNORMAL LOW (ref >90)
GFR calc non Af Amer: 51 mL/min — ABNORMAL LOW (ref >90)
Glucose, Bld: 131 mg/dL — ABNORMAL HIGH (ref 70–99)
Potassium: 3.8 mEq/L (ref 3.5–5.1)
Sodium: 140 mEq/L (ref 135–145)

## 2012-11-13 MED ORDER — PERFLUTREN LIPID MICROSPHERE
1.0000 mL | INTRAVENOUS | Status: AC | PRN
  Administered 2012-11-13: 2 mL via INTRAVENOUS
  Filled 2012-11-13: qty 10

## 2012-11-13 MED ORDER — METOPROLOL TARTRATE 50 MG PO TABS
50.0000 mg | ORAL_TABLET | Freq: Two times a day (BID) | ORAL | Status: DC
  Administered 2012-11-13 – 2012-11-17 (×9): 50 mg via ORAL
  Filled 2012-11-13 (×10): qty 1

## 2012-11-13 NOTE — Progress Notes (Signed)
Echocardiogram 2D Echocardiogram has been performed.  Victor Morgan 11/13/2012, 1:50 PM

## 2012-11-13 NOTE — Progress Notes (Signed)
eLink Physician-Brief Progress Note Patient Name: Victor Morgan DOB: [birth year] MRN: [id number]  Date of Service  11/13/2012   HPI/Events of Note  Call from nurse reporting that patient has erythema and some swelling of the LL extremity.  Some pain on palpation.     eICU Interventions  Plan; Lower extremity dopplers to evaluate for DVT   Intervention Category Minor Interventions: Clinical assessment - ordering diagnostic tests  DETERDING,ELIZABETH 11/13/2012, 5:48 AM

## 2012-11-13 NOTE — Progress Notes (Signed)
PULMONARY  / CRITICAL CARE MEDICINE  Name: Victor Morgan MRN: [id number] DOB: 01-15-1933    ADMISSION DATE:  11/11/2012   REFERRING MD :  ED PRIMARY SERVICE: PCCM  CHIEF COMPLAINT:  AMS  BRIEF PATIENT DESCRIPTION: 79 year old male presented to ED after wife noted him to be confused at home. Recent facial surgery ( last Tuesday), found to be hypotensive in ED, with lactic acidosis and AKI.  SIGNIFICANT EVENTS / STUDIES:  5/4/>>> hypotensive with MAP in 40's, Lactate of 7, AKI 5/4  Renal US>>>Negative for hydronephrosis. Bilateral renal cysts  LINES / TUBES: 5/4 urinary catheter>>> 5/4 a-line>>>  CULTURES: Blood 11/11/12>>> Urine 11/11/12>>>  ANTIBIOTICS: 5/4 Vancomycin >>> 5/4 zosyn>>>  SUBJECTIVE: Pt states that he is sleepy and in no pain.   VITAL SIGNS: Temp:  [98 F (36.7 C)-99.6 F (37.6 C)] 99.6 F (37.6 C) (05/05 0407) Pulse Rate:  [82-96] 92 (05/05 0600) Resp:  [20-26] 26 (05/05 0600) BP: (96-170)/(39-77) 170/65 mmHg (05/05 0600) SpO2:  [92 %-98 %] 92 % (05/05 0600) Arterial Line BP: (99-185)/(34-73) 179/62 mmHg (05/05 0500) HEMODYNAMICS:   VENTILATOR SETTINGS:   INTAKE / OUTPUT: Intake/Output     05/04 0701 - 05/05 0700 05/05 0701 - 05/06 0700   P.O. 920    I.V. (mL/kg) 1617.5 (15.5)    IV Piggyback 581    Total Intake(mL/kg) 3118.5 (29.9)    Urine (mL/kg/hr) 3350 (1.3)    Total Output 3350     Net -231.5            PHYSICAL EXAMINATION: General: 79 yo WM sleepy but alert in NAD HEENT - Healing surgical incisions across his entire forehead. Ecchymosis surrounding this without erythema drainage or fluctuance. Bilateral periorbital ecchymosis as well as ecchymosis going down his bilateral cheeks.  Neck: supple CVS- s1s2 nml, no murmur, no JVD RS- clear to auscultation bilaterally, no crackles or rhonchi Abd- Obese, soft, non-tender CNS- GCS 15,  non focal Ext - no clubbing, cyanosis, 1+ bilateral pedal edema. Swelling and erythema of LLE with  mild tenderness on palpation.   LABS:  Recent Labs Lab 11/11/12 2058  11/12/12 0233 11/12/12 0300 11/12/12 0833 11/12/12 1433 11/12/12 1610 11/13/12 0500  HGB 16.4  --   --  13.1  --   --  13.1 12.6*  WBC 16.7*  --   --  20.8*  --   --  21.6* 19.9*  PLT 196  --   --  175  --   --  153 139*  NA 140  --   --  139  --   --  136 140  K 3.1*  --   --  3.6  --   --  4.1 3.8  CL 99  --   --  104  --   --  103 107  CO2 20  --   --  16*  --   --  20 20  GLUCOSE 169*  --   --  157*  --   --  127* 131*  BUN 35*  --   --  36*  --   --  28* 21  CREATININE 1.77*  --   --  2.04*  --   --  1.51* 1.31  CALCIUM 9.4  --   --  7.8*  --   --  8.0* 8.0*  MG  --   --   --  0.8*  --   --  2.0 2.0  PHOS  --   --   --  1.2*  --   --   --   --   AST 35  --   --   --   --   --  50*  --   ALT 34  --   --   --   --   --  31  --   ALKPHOS 57  --   --   --   --   --  54  --   BILITOT 0.6  --   --   --   --   --  0.7  --   PROT 7.5  --   --   --   --   --  6.2  --   ALBUMIN 3.9  --   --   --   --   --  3.0*  --   APTT  --   --   --  34  --   --   --   --   INR  --   --   --  1.22  --   --   --   --   LATICACIDVEN  --   < > 6.2*  --  5.1* 2.2  --   --   TROPONINI  --   < > 0.48*  --  0.40* 0.46*  --   --   PROCALCITON  --   --   --  42.40  --   --   --   --   < > = values in this interval not displayed.  Recent Labs Lab 11/12/12 1100 11/12/12 1523 11/12/12 2020 11/12/12 2350 11/13/12 0344  GLUCAP 166* 170* 133* 125* 122*    CXR: 5/3>>> Right side atelectasis, poor inspiratory effort. Prior sternotomy  ASSESSMENT / PLAN:  PULMONARY A:Atelectasis P:   -supplemental oxygen -incentive spirometry -to ra as goal  CARDIOVASCULAR A:  severe sepsis Mild HTN now Demand ischemia P:  -IV resuscitation consider end -add home metoprolol as now BP up, start at 50  -continue asa  -echo awaited  RENAL A:  AKI- resolved  renal cysts- [place] 5/4: no evidence of hydronephrosis Hypophosphatemia and  hypomagnesia-resolved P:   -IVF kvo -avoid nephrotoxic meds and renally dose meds -BMP in am  GASTROINTESTINAL A:  GERD P:   -protonix (hom e med) -cardiac diet  HEMATOLOGIC A:  Leukocytosis Questionable DVT?-erythema, swelling, and pain on palpation of LL extremity P:  -likely due to SIRS/Sepsis -monitor WBC -lower extremity dopplers ordered -heparin for DVT prophylaxis  INFECTIOUS A:  Sepsis; unclear source (cellilitis now late ID, facial translocation ) P:   -Random cortisol level 5/4 28.7 -Lactic acid level stabilized -Transthoracic echocardiogram to evaluate LV function -IV resucistation -Empirical Zosyn, Vancomycin - dc , consider empiric zosyn 8 days given surgery exposure  ( nosocomial) and dm  ENDOCRINE A:  Hyperglycemia, DM   P:   -continue to hold metformin -SSQ insulin -monitor FSBS  NEUROLOGIC A:  Encephalopathy likely due to sepsis and dehydration-resolved P:   -mentation back to baseline after hydration -continue to monitor  TODAY'S SUMMARY: Patient reported pain and swelling of LL extremity, lower extremity dopplers ordered.  To floor To traid  I have personally obtained a history, examined the patient, evaluated laboratory and imaging results, formulated the assessment and plan and placed orders.  Mcarthur Rossetti. Tyson Alias, MD, FACP Pgr: [phone] [facility]

## 2012-11-13 NOTE — Progress Notes (Signed)
VASCULAR LAB PRELIMINARY  PRELIMINARY  PRELIMINARY  PRELIMINARY  Bilateral lower extremity venous duplex  completed.    Preliminary report:  Bilateral:  No evidence of DVT, superficial thrombosis, or Baker's Cyst.    Jelisa [place], RVT 11/13/2012, 10:51 AM

## 2012-11-14 ENCOUNTER — Encounter (HOSPITAL_COMMUNITY): Payer: Self-pay

## 2012-11-14 ENCOUNTER — Inpatient Hospital Stay (HOSPITAL_COMMUNITY): Payer: Medicare Other

## 2012-11-14 DIAGNOSIS — E119 Type 2 diabetes mellitus without complications: Secondary | ICD-10-CM

## 2012-11-14 DIAGNOSIS — A419 Sepsis, unspecified organism: Principal | ICD-10-CM

## 2012-11-14 DIAGNOSIS — J189 Pneumonia, unspecified organism: Secondary | ICD-10-CM

## 2012-11-14 LAB — BASIC METABOLIC PANEL
BUN: 15 mg/dL (ref 6–23)
CO2: 25 mEq/L (ref 19–32)
Calcium: 9 mg/dL (ref 8.4–10.5)
Glucose, Bld: 136 mg/dL — ABNORMAL HIGH (ref 70–99)
Potassium: 3.6 mEq/L (ref 3.5–5.1)
Sodium: 138 mEq/L (ref 135–145)

## 2012-11-14 LAB — GLUCOSE, CAPILLARY

## 2012-11-14 MED ORDER — VANCOMYCIN HCL 10 G IV SOLR
2000.0000 mg | Freq: Once | INTRAVENOUS | Status: AC
  Administered 2012-11-14: 2000 mg via INTRAVENOUS
  Filled 2012-11-14: qty 2000

## 2012-11-14 MED ORDER — PNEUMOCOCCAL VAC POLYVALENT 25 MCG/0.5ML IJ INJ
0.5000 mL | INJECTION | INTRAMUSCULAR | Status: AC
  Filled 2012-11-14: qty 0.5

## 2012-11-14 MED ORDER — VANCOMYCIN HCL IN DEXTROSE 1-5 GM/200ML-% IV SOLN
1000.0000 mg | Freq: Two times a day (BID) | INTRAVENOUS | Status: DC
  Administered 2012-11-15 – 2012-11-17 (×5): 1000 mg via INTRAVENOUS
  Filled 2012-11-14 (×6): qty 200

## 2012-11-14 MED ORDER — INSULIN ASPART 100 UNIT/ML ~~LOC~~ SOLN
0.0000 [IU] | Freq: Three times a day (TID) | SUBCUTANEOUS | Status: DC
  Administered 2012-11-15 – 2012-11-16 (×2): 2 [IU] via SUBCUTANEOUS
  Administered 2012-11-16 (×2): 1 [IU] via SUBCUTANEOUS
  Administered 2012-11-17: 13:00:00 via SUBCUTANEOUS
  Administered 2012-11-17: 1 [IU] via SUBCUTANEOUS

## 2012-11-14 MED ORDER — IOHEXOL 300 MG/ML  SOLN
100.0000 mL | Freq: Once | INTRAMUSCULAR | Status: AC | PRN
  Administered 2012-11-14: 80 mL via INTRAVENOUS

## 2012-11-14 NOTE — Progress Notes (Signed)
[facility] PROGRESS NOTE  Victor Morgan ZOX:[id number] DOB: 12/26/1932 DOA: 11/11/2012 PCP: Pamelia Hoit, MD  HPI/Subjective: Feels okay, denies any fever or chills.  Assessment/Plan:  Severe sepsis -On admission had lactate of 7.3 and procalcitonin of 42.4. -Initially patient was admitted to the ICU started empirically on broad-spectrum antibiotics (vancomycin and Zosyn). -Hypotension resolved, his WBCs is about the same but is not getting worse. -The lactate is normal, his mental status is at baseline, no evidence of facial infection. -His urine is negative, his x-rays clear, blood cultures pending, continue broad-spectrum antibiotics for now. -This is could be secondary to left lower extremity cellulitis  Left lower extremity cellulitis -Patient has left leg swelling with pitting edema, Doppler ultrasound was negative for DVT. -Patient is on antibiotics for severe sepsis, sepsis source is unclear, assumed to be secondary to left leg cellulitis.  Acute renal failure -Secondary to dehydration and sepsis. Patient presented with creatinine of 1.7 a.m. and reached a peak of 2.04 -Patient creatinine is improved to normal after aggressive hydration with IV fluids.  Leukocytosis -Secondary to the sepsis syndrome. Slowly improving.  Elevated troponin -Marginally elevated troponins patient denies any chest pain. I don't think this is related to ACS. -This is likely secondary to the sepsis/hypotension and the acute renal failure. -Patient already on aspirin and beta blockers.  Diabetes mellitus type 2 -Controlled diabetes mellitus type 2 with A1c of 6.2. -Patient home medications held on admission, probably restart on discharge. -Continue insulin sliding scale and carbohydrate modified diet.  Code Status: Full code Family Communication: I discussed the plan of care with the patient Disposition Plan: Remains inpatient, throat cultures expect discharge on Thursday or  Friday per   Consultants:  [facility] assumed service on 5 12/10/2012 and from [facility] medicine service  Procedures:  Bilateral lower extremity Dopplers negative for DVT  Antibiotics:  Zosyn and vancomycin.    Objective: Filed Vitals:   11/13/12 2035 11/13/12 2100 11/14/12 0435 11/14/12 0944  BP: 147/57  156/74 150/79  Pulse: 74  70 69  Temp: 98.8 F (37.1 C)  98 F (36.7 C)   TempSrc: Oral  Oral   Resp: 28 22 26    Height: 5\' 11"  (1.803 m)     Weight: 104.3 kg (229 lb 15 oz)  104.4 kg (230 lb 2.6 oz)   SpO2: 94%  94%     Intake/Output Summary (Last 24 hours) at 11/14/12 1102 Last data filed at 11/14/12 0846  Gross per 24 hour  Intake   1660 ml  Output   3125 ml  Net  -1465 ml   Filed Weights   11/12/12 0205 11/13/12 2035 11/14/12 0435  Weight: 104.2 kg (229 lb 11.5 oz) 104.3 kg (229 lb 15 oz) 104.4 kg (230 lb 2.6 oz)    Exam:  General: Alert and awake, oriented x3, not in any acute distress. HEENT: anicteric sclera, pupils reactive to light and accommodation, EOMI CVS: S1-S2 clear, no murmur rubs or gallops Chest: clear to auscultation bilaterally, no wheezing, rales or rhonchi Abdomen: soft nontender, nondistended, normal bowel sounds, no organomegaly Extremities: no cyanosis, clubbing or edema noted bilaterally Neuro: Cranial nerves II-XII intact, no focal neurological deficits  Data Reviewed: Basic Metabolic Panel:  Recent Labs Lab 11/11/12 2058 11/12/12 0300 11/12/12 1610 11/13/12 0500 11/14/12 0500  NA 140 139 136 140 138  K 3.1* 3.6 4.1 3.8 3.6  CL 99 104 103 107 101  CO2 20 16* 20 20 25   GLUCOSE 169* 157* 127* 131*  136*  BUN 35* 36* 28* 21 15  CREATININE 1.77* 2.04* 1.51* 1.31 1.15  CALCIUM 9.4 7.8* 8.0* 8.0* 9.0  MG  --  0.8* 2.0 2.0  --   PHOS  --  1.2*  --   --   --    Liver Function Tests:  Recent Labs Lab 11/11/12 2058 11/12/12 1610  AST 35 50*  ALT 34 31  ALKPHOS 57 54  BILITOT 0.6 0.7  PROT 7.5 6.2  ALBUMIN 3.9  3.0*   No results found for this basename: LIPASE, AMYLASE,  in the last 168 hours No results found for this basename: AMMONIA,  in the last 168 hours CBC:  Recent Labs Lab 11/11/12 2058 11/12/12 0300 11/12/12 1610 11/13/12 0500  WBC 16.7* 20.8* 21.6* 19.9*  NEUTROABS 14.8*  --   --   --   HGB 16.4 13.1 13.1 12.6*  HCT 46.8 38.5* 37.9* 37.2*  MCV 86.5 86.3 85.9 86.1  PLT 196 175 153 139*   Cardiac Enzymes:  Recent Labs Lab 11/11/12 2218 11/12/12 0233 11/12/12 0833 11/12/12 1433  TROPONINI 0.40* 0.48* 0.40* 0.46*   BNP (last 3 results) No results found for this basename: PROBNP,  in the last 8760 hours CBG:  Recent Labs Lab 11/13/12 1533 11/13/12 2033 11/14/12 0008 11/14/12 0440 11/14/12 0743  GLUCAP 179* 130* 132* 140* 117*    Recent Results (from the past 240 hour(s))  CULTURE, BLOOD (ROUTINE X 2)     Status: None   Collection Time    11/11/12 10:25 PM      Result Value Range Status   Specimen Description BLOOD RIGHT ARM   Final   Special Requests BOTTLES DRAWN AEROBIC AND ANAEROBIC 10CC EA   Final   Culture  Setup Time 11/12/2012 04:25   Final   Culture     Final   Value:        BLOOD CULTURE RECEIVED NO GROWTH TO DATE CULTURE WILL BE HELD FOR 5 DAYS BEFORE ISSUING A FINAL NEGATIVE REPORT   Report Status PENDING   Incomplete  CULTURE, BLOOD (ROUTINE X 2)     Status: None   Collection Time    11/11/12 10:35 PM      Result Value Range Status   Specimen Description BLOOD RIGHT HAND   Final   Special Requests BOTTLES DRAWN AEROBIC ONLY 10CC   Final   Culture  Setup Time 11/12/2012 04:25   Final   Culture     Final   Value:        BLOOD CULTURE RECEIVED NO GROWTH TO DATE CULTURE WILL BE HELD FOR 5 DAYS BEFORE ISSUING A FINAL NEGATIVE REPORT   Report Status PENDING   Incomplete  URINE CULTURE     Status: None   Collection Time    11/11/12 11:48 PM      Result Value Range Status   Specimen Description URINE, CATHETERIZED   Final   Special Requests NONE    Final   Culture  Setup Time 11/12/2012 12:50   Final   Colony Count NO GROWTH   Final   Culture NO GROWTH   Final   Report Status 11/13/2012 FINAL   Final  MRSA PCR SCREENING     Status: None   Collection Time    11/12/12  1:55 AM      Result Value Range Status   MRSA by PCR NEGATIVE  NEGATIVE Final   Comment:  The GeneXpert MRSA Assay (FDA     approved for NASAL specimens     only), is one component of a     comprehensive MRSA colonization     surveillance program. It is not     intended to diagnose MRSA     infection nor to guide or     monitor treatment for     MRSA infections.  URINE CULTURE     Status: None   Collection Time    11/12/12  5:24 AM      Result Value Range Status   Specimen Description URINE, CLEAN CATCH   Final   Special Requests NONE   Final   Culture  Setup Time 11/12/2012 12:50   Final   Colony Count NO GROWTH   Final   Culture NO GROWTH   Final   Report Status 11/13/2012 FINAL   Final     Studies: US Renal Port  11/12/2012  *RADIOLOGY REPORT*  Clinical Data: Acute renal insufficiency, diabetes, hypertension  RENAL/URINARY TRACT ULTRASOUND COMPLETE  Comparison:  None.  Findings:  Right Kidney:  Measures 14.5 cm.  Normal echogenicity and cortical thickness.  No hydronephrosis.  Right kidney lower pole demonstrates a 4.5 x 4.8 x 3.7 cm hypoechoic minimally septated cyst.  No other acute finding.  Left Kidney:  14.4 cm length.  Normal cortex and echogenicity. Left upper pole hypoechoic renal cyst measures 2.9 x 3.3 x 2.4 cm. Small exophytic mid pole hypoechoic cyst measures 13 mm.  No hydronephrosis or acute finding.  Bladder:  Not visualized.  Accessory splenule noted in the left upper quadrant.  No abdominal free fluid  IMPRESSION: Negative for hydronephrosis.  Bilateral renal cysts   Original Report Authenticated By: Judie Petit. Shick, M.D.     Scheduled Meds: . aspirin EC  81 mg Oral BID  . B-complex with vitamin C  1 tablet Oral Daily  .  bacitracin-polymyxin b   Both Eyes BID  . cholecalciferol  2,000 Units Oral Daily  . fluticasone  2 spray Each Nare BID  . heparin  5,000 Units Subcutaneous Q8H  . insulin aspart  0-9 Units Subcutaneous Q4H  . metoprolol tartrate  50 mg Oral BID  . multivitamin-lutein  1 capsule Oral Daily  . omega-3 acid ethyl esters  1 g Oral Daily  . pantoprazole  40 mg Oral Daily  . piperacillin-tazobactam (ZOSYN)  IV  3.375 g Intravenous Q8H  . [START ON 11/15/2012] pneumococcal 23 valent vaccine  0.5 mL Intramuscular Tomorrow-1000  . prednisoLONE acetate  1 drop Left Eye Daily  . triamcinolone cream  1 application Topical BID  . ascorbic acid  1,000 mg Oral Daily  . vitamin E  800 Units Oral Daily   Continuous Infusions: . sodium chloride 1,000 mL (11/13/12 1900)    Principal Problem:   AKI (acute kidney injury) Active Problems:   Sepsis   Hypokalemia   Hyperglycemia    Time spent: 35 minutes     [facility]  [facility] Pager [phone]. If 7PM-7AM, please contact night-coverage at [email or web address], password [facility] 11/14/2012, 11:02 AM  LOS: 3 days

## 2012-11-14 NOTE — Progress Notes (Signed)
Victor Morgan is a 79 y.o. male patient who transferred  from 2100 awake, alert  & orientated  X 4  Full Code, VSS - Blood pressure 147/57, pulse 74, temperature 98.8 F (37.1 C), temperature source Oral, resp. rate 22, height 5\' 11"  (1.803 m), weight 104.3 kg (229 lb 15 oz), SpO2 94.00%.,RA no c/o shortness of breath, no c/o chest pain, no distress noted. nontele   IV site WDL: hand right, condition patent and no redness and antecubital left, condition patent and no redness with a transparent dsg that's clean dry and intact.  Allergies:  No Known Allergies   Past Medical History  Diagnosis Date  . Cough     chronic cough d/t sinusitis  . Diabetes mellitus     type II  . Hyperlipidemia   . Sleep apnea, obstructive     CPAP NIGHTLY  . Myocardial infarction 1997  . Hx of heart artery stent 1997    PTA  AND STENT X2 TO LAD,STENT TO CICUMFLEX  . Obesity, mild   . Hypertension     Pt orientation to unit, room and routine. SR up x 2, fall risk assessment complete with Patient verbalizing understanding of risks associated with falls. Pt verbalizes an understanding of how to use the call bell and to call for help before getting out of bed.  Skin, clean-dry, brusing to face, sutures above bil eyes (patient states from an eye brow lift), redness and warmth to LLE (cellulitis) area previously marked with a pen, scratch to RUE scabbed and open to air. No evidence of skin break down noted on exam.   Will cont to monitor and assist as needed.  Julien Nordmann [facility], RN 11/14/2012 3:15 AM

## 2012-11-14 NOTE — Progress Notes (Signed)
Victor Morgan is a 79 y.o. male patient who transferred  from 2100 awake, alert  & orientated  X 4, Full Code, VSS Blood pressure 144/57 , pulse 74 , temperature 98.8 , temperature source Oral, resp. rate 22, height 5\' 11"  (1.803 m), weight 104.4 kg (230 lb 2.6 oz), SpO2 94.00%. RA, no c/o shortness of breath, no c/o chest pain, no distress noted. Non tele    IV site WDL: hand right, condition patent and no redness and antecubital left, condition patent and no redness with a transparent dsg that's clean dry and intact.  Allergies:  No Known Allergies   Past Medical History  Diagnosis Date  . Cough     chronic cough d/t sinusitis  . Diabetes mellitus     type II  . Hyperlipidemia   . Sleep apnea, obstructive     CPAP NIGHTLY  . Myocardial infarction 1997  . Hx of heart artery stent 1997    PTA  AND STENT X2 TO LAD,STENT TO CICUMFLEX  . Obesity, mild   . Hypertension     Pt orientation to unit, room and routine. SR up x 2, fall risk assessment complete with Patient verbalizing understanding of risks associated with falls. Pt verbalizes an understanding of how to use the call bell and to call for help before getting out of bed.  Skin, clean & dry with sutures above bil eyes, pt states " I had a brow lift to improve my vision". Facial bruise and a scab to RUE open to air. No evidence of skin break down noted on exam.    Will cont to monitor and assist as needed.  Julien Nordmann [facility], RN 11/14/2012 5:21 AM

## 2012-11-14 NOTE — Progress Notes (Addendum)
ANTIBIOTIC CONSULT NOTE - FOLLOW UP  Pharmacy Consult for Zosyn, Vancomycin Indication:  Sepsis, LE cellulitis  No Known Allergies  Patient Measurements: Height: 5\' 11"  (180.3 cm) Weight: 230 lb 2.6 oz (104.4 kg) IBW/kg (Calculated) : 75.3 Adjusted Body Weight:   Vital Signs: Temp: 98 F (36.7 C) (05/06 0435) Temp src: Oral (05/06 0435) BP: 150/79 mmHg (05/06 0944) Pulse Rate: 69 (05/06 0944) Intake/Output from previous day: 05/05 0701 - 05/06 0700 In: 2425 [P.O.:1920; I.V.:405; IV Piggyback:100] Out: 3125 [Urine:3125] Intake/Output from this shift: Total I/O In: -  Out: 500 [Urine:500]  Labs:  Recent Labs  11/12/12 0300 11/12/12 1610 11/13/12 0500 11/14/12 0500  WBC 20.8* 21.6* 19.9*  --   HGB 13.1 13.1 12.6*  --   PLT 175 153 139*  --   CREATININE 2.04* 1.51* 1.31 1.15   Estimated Creatinine Clearance: 66.1 ml/min (by C-G formula based on Cr of 1.15). No results found for this basename: VANCOTROUGH, VANCOPEAK, VANCORANDOM, GENTTROUGH, GENTPEAK, GENTRANDOM, TOBRATROUGH, TOBRAPEAK, TOBRARND, AMIKACINPEAK, AMIKACINTROU, AMIKACIN,  in the last 72 hours   Microbiology: Recent Results (from the past 720 hour(s))  CULTURE, BLOOD (ROUTINE X 2)     Status: None   Collection Time    11/11/12 10:25 PM      Result Value Range Status   Specimen Description BLOOD RIGHT ARM   Final   Special Requests BOTTLES DRAWN AEROBIC AND ANAEROBIC 10CC EA   Final   Culture  Setup Time 11/12/2012 04:25   Final   Culture     Final   Value:        BLOOD CULTURE RECEIVED NO GROWTH TO DATE CULTURE WILL BE HELD FOR 5 DAYS BEFORE ISSUING A FINAL NEGATIVE REPORT   Report Status PENDING   Incomplete  CULTURE, BLOOD (ROUTINE X 2)     Status: None   Collection Time    11/11/12 10:35 PM      Result Value Range Status   Specimen Description BLOOD RIGHT HAND   Final   Special Requests BOTTLES DRAWN AEROBIC ONLY 10CC   Final   Culture  Setup Time 11/12/2012 04:25   Final   Culture     Final    Value:        BLOOD CULTURE RECEIVED NO GROWTH TO DATE CULTURE WILL BE HELD FOR 5 DAYS BEFORE ISSUING A FINAL NEGATIVE REPORT   Report Status PENDING   Incomplete  URINE CULTURE     Status: None   Collection Time    11/11/12 11:48 PM      Result Value Range Status   Specimen Description URINE, CATHETERIZED   Final   Special Requests NONE   Final   Culture  Setup Time 11/12/2012 12:50   Final   Colony Count NO GROWTH   Final   Culture NO GROWTH   Final   Report Status 11/13/2012 FINAL   Final  MRSA PCR SCREENING     Status: None   Collection Time    11/12/12  1:55 AM      Result Value Range Status   MRSA by PCR NEGATIVE  NEGATIVE Final   Comment:            The GeneXpert MRSA Assay (FDA     approved for NASAL specimens     only), is one component of a     comprehensive MRSA colonization     surveillance program. It is not     intended to diagnose MRSA  infection nor to guide or     monitor treatment for     MRSA infections.  URINE CULTURE     Status: None   Collection Time    11/12/12  5:24 AM      Result Value Range Status   Specimen Description URINE, CLEAN CATCH   Final   Special Requests NONE   Final   Culture  Setup Time 11/12/2012 12:50   Final   Colony Count NO GROWTH   Final   Culture NO GROWTH   Final   Report Status 11/13/2012 FINAL   Final    Anti-infectives   Start     Dose/Rate Route Frequency Ordered Stop   11/12/12 2200  vancomycin (VANCOCIN) IVPB 1000 mg/200 mL premix  Status:  Discontinued     1,000 mg 200 mL/hr over 60 Minutes Intravenous Every 24 hours 11/12/12 0247 11/13/12 1049   11/12/12 1000  piperacillin-tazobactam (ZOSYN) IVPB 3.375 g     3.375 g 12.5 mL/hr over 240 Minutes Intravenous Every 8 hours 11/12/12 0247 11/19/12 2359   11/12/12 0400  vancomycin (VANCOCIN) IVPB 1000 mg/200 mL premix     1,000 mg 200 mL/hr over 60 Minutes Intravenous  Once 11/12/12 0232 11/12/12 0542   11/12/12 0300  piperacillin-tazobactam (ZOSYN) IVPB 3.375 g      3.375 g 100 mL/hr over 30 Minutes Intravenous  Once 11/12/12 0232 11/12/12 0339   11/12/12 0000  vancomycin (VANCOCIN) IVPB 1000 mg/200 mL premix     1,000 mg 200 mL/hr over 60 Minutes Intravenous  Once 11/11/12 2348 11/12/12 0204   11/11/12 2245  cefTRIAXone (ROCEPHIN) 1 g in dextrose 5 % 50 mL IVPB     1 g 100 mL/hr over 30 Minutes Intravenous  Once 11/11/12 2238 11/11/12 2332   11/11/12 2245  azithromycin (ZITHROMAX) 500 mg in dextrose 5 % 250 mL IVPB     500 mg 250 mL/hr over 60 Minutes Intravenous  Once 11/11/12 2238 11/12/12 0107      Assessment: 79yo male on Zosyn day #3/8 for sepsis and LE cellulitis.  He is afebrile and last WBC was improving.  Doppler is (-)DVT.  Blood cultures and negative to date.    Goal of Therapy:  resolution of infection  Plan:  1.  Continue Zosyn at current dosing 2.  F/U culture results 3.  Watch renal function  Marisue Humble, PharmD Clinical Pharmacist [facility]  Have now received orders to add back vancomycin. Renal function has improved significantly. Last vancomycin dose was 5/4 @ 2126, so will need to re-load.  Plan: 1) Vancomycin 2gm IV x 1 then 1000mg  IV q12 2) Trough at steady state  Louie Casa, PharmD, BCPS 11/14/12, 11:40 AM

## 2012-11-15 DIAGNOSIS — R931 Abnormal findings on diagnostic imaging of heart and coronary circulation: Secondary | ICD-10-CM | POA: Diagnosis present

## 2012-11-15 DIAGNOSIS — E1165 Type 2 diabetes mellitus with hyperglycemia: Secondary | ICD-10-CM | POA: Diagnosis present

## 2012-11-15 DIAGNOSIS — R7989 Other specified abnormal findings of blood chemistry: Secondary | ICD-10-CM

## 2012-11-15 DIAGNOSIS — R778 Other specified abnormalities of plasma proteins: Secondary | ICD-10-CM | POA: Diagnosis present

## 2012-11-15 DIAGNOSIS — J449 Chronic obstructive pulmonary disease, unspecified: Secondary | ICD-10-CM | POA: Diagnosis present

## 2012-11-15 DIAGNOSIS — I25810 Atherosclerosis of coronary artery bypass graft(s) without angina pectoris: Secondary | ICD-10-CM | POA: Diagnosis present

## 2012-11-15 DIAGNOSIS — E118 Type 2 diabetes mellitus with unspecified complications: Secondary | ICD-10-CM | POA: Diagnosis present

## 2012-11-15 LAB — CBC
HCT: 39.7 % (ref 39.0–52.0)
Hemoglobin: 13.6 g/dL (ref 13.0–17.0)
MCH: 29.4 pg (ref 26.0–34.0)
MCHC: 34.3 g/dL (ref 30.0–36.0)
MCV: 85.7 fL (ref 78.0–100.0)

## 2012-11-15 LAB — BASIC METABOLIC PANEL
BUN: 15 mg/dL (ref 6–23)
Chloride: 101 mEq/L (ref 96–112)
Glucose, Bld: 127 mg/dL — ABNORMAL HIGH (ref 70–99)
Potassium: 3.4 mEq/L — ABNORMAL LOW (ref 3.5–5.1)

## 2012-11-15 LAB — GLUCOSE, CAPILLARY: Glucose-Capillary: 154 mg/dL — ABNORMAL HIGH (ref 70–99)

## 2012-11-15 NOTE — Consult Note (Signed)
Reason for Consult: Abnormal echo compared with 10/31/12 and elevated Troponin  Requesting Physician: Claybon Jabs  HPI: This is a 79 y.o. male with a past medical history significant for CAD. He had an MI in 1997 Rx'd with LAD stenting X 2 followed by staged CGF stenting. He ultimately had a CABG X 17 May 2011 with L-LAD, S-Dx1, S-OM2, S-AM and PDA. He just saw Dr Alanda Amass in the office 10/13/12. An echo was done that showed an EF of 50-55%. The pt was admitted 11/11/12 with MS changes, hypotension, and acute renal injury, felt to be secondary to septic shock. This was felt to be from  Lt leg cellulitis. During this admission Troponin's were elevated 0.4-0.48. An echo was done 11/13/12 that showed an EF of 40-50% with multiple WMA not mentioned on previous echo. The pt denies any chest pain history. His renal function and B/P are now stable.  PMHx:  Past Medical History  Diagnosis Date  . Cough     chronic cough d/t sinusitis  . Diabetes mellitus     type II  . Hyperlipidemia   . Sleep apnea, obstructive     CPAP NIGHTLY  . Myocardial infarction 1997  . Hx of heart artery stent 1997    PTA  AND STENT X2 TO LAD,STENT TO CICUMFLEX  . Obesity, mild   . Hypertension    Past Surgical History  Procedure Laterality Date  . Heart bypass  nov 2012  . Cornea graft      bilateral eyes  . Cataract extraction      bilateral eyes    FAMHx: Family History  Problem Relation Age of Onset  . Colon cancer Mother     SOCHx:  reports that he quit smoking about 16 years ago. His smoking use included Cigarettes. He has a 45 pack-year smoking history. He has never used smokeless tobacco. He reports that he does not drink alcohol. His drug history is not on file.  ALLERGIES: No Known Allergies  ROS: Pertinent items are noted in HPI. recent eye brow lift surgery  HOME MEDICATIONS: Prescriptions prior to admission  Medication Sig Dispense Refill  . acetaminophen (TYLENOL) 500 MG tablet Take 1,000  mg by mouth every 6 (six) hours as needed for pain.      Marland Kitchen ascorbic acid (VITAMIN C) 1000 MG tablet Take 1,000 mg by mouth daily.      Marland Kitchen aspirin 81 MG tablet Take 81 mg by mouth 2 (two) times daily.      . B Complex-C (B-COMPLEX WITH VITAMIN C) tablet Take 1 tablet by mouth daily.      . cetirizine (ZYRTEC) 10 MG chewable tablet Chew 10 mg by mouth daily.      . Cholecalciferol (VITAMIN D) 2000 UNITS tablet Take 2,000 Units by mouth daily.      . fluticasone (FLONASE) 50 MCG/ACT nasal spray Place 2 sprays into the nose 2 (two) times daily.      . hydrochlorothiazide (HYDRODIURIL) 25 MG tablet Take 25 mg by mouth daily.        . Ipratropium-Albuterol (COMBIVENT RESPIMAT) 20-100 MCG/ACT AERS respimat Inhale 1 puff into the lungs 4 (four) times daily as needed for wheezing or shortness of breath.       . metFORMIN (GLUCOPHAGE) 1000 MG tablet Take 1,000 mg by mouth 2 (two) times daily with a meal.        . metoprolol (LOPRESSOR) 50 MG tablet Take 75 mg by mouth 2 (two) times daily.      Marland Kitchen  multivitamin-lutein (OCUVITE-LUTEIN) CAPS Take 1 capsule by mouth daily.      Marland Kitchen neomycin-bacitracin-polymyxin (POLYSPORIN) ophthalmic ointment Apply 1 application topically 2 (two) times daily. For facial cuts      . nitroGLYCERIN (NITROSTAT) 0.4 MG SL tablet Place 0.4 mg under the tongue every 5 (five) minutes as needed.      . Omega-3 Fatty Acids (FISH OIL) 1200 MG CPDR Take 1,200 mg by mouth daily.      Marland Kitchen omeprazole (PRILOSEC) 20 MG capsule Take 20 mg by mouth daily.        . prednisoLONE acetate (PRED FORTE) 1 % ophthalmic suspension Place 1 drop into the left eye daily.      . rosuvastatin (CRESTOR) 40 MG tablet Take 20 mg by mouth daily.      . sodium chloride (MURO 128) 5 % ophthalmic solution Place 1 drop into the left eye 3 (three) times daily.      . Sodium Chloride-Sodium Bicarb (SINUS WASH NETI POT NA) Place 1 application into the nose 2 (two) times daily as needed (for washing sinuses).       .  triamcinolone cream (KENALOG) 0.1 % Apply 1 application topically 2 (two) times daily.       . vitamin E 1000 UNIT capsule Take 1,000 Units by mouth daily.        HOSPITAL MEDICATIONS: I have reviewed the patient's current medications.  VITALS: Blood pressure 151/77, pulse 66, temperature 97.6 F (36.4 C), temperature source Oral, resp. rate 20, height 5\' 11"  (1.803 m), weight 104 kg (229 lb 4.5 oz), SpO2 96.00%.  PHYSICAL EXAM: General appearance: alert, cooperative and no distress Neck: no carotid bruit and no JVD Lungs: clear to auscultation bilaterally Heart: regular rate and rhythm Abdomen: soft, non tender Extremities: LLE red, warm. swollen Pulses: diminnished Skin: facial bruising from recent eye brow surgery Neurologic: Grossly normal  LABS: Results for orders placed during the hospital encounter of 11/11/12 (from the past 48 hour(s))  GLUCOSE, CAPILLARY     Status: Abnormal   Collection Time    11/13/12  3:33 PM      Result Value Range   Glucose-Capillary 179 (*) 70 - 99 mg/dL  GLUCOSE, CAPILLARY     Status: Abnormal   Collection Time    11/13/12  8:33 PM      Result Value Range   Glucose-Capillary 130 (*) 70 - 99 mg/dL   Comment 1 Documented in Chart     Comment 2 Notify RN    GLUCOSE, CAPILLARY     Status: Abnormal   Collection Time    11/14/12 12:08 AM      Result Value Range   Glucose-Capillary 132 (*) 70 - 99 mg/dL   Comment 1 Documented in Chart     Comment 2 Notify RN    GLUCOSE, CAPILLARY     Status: Abnormal   Collection Time    11/14/12  4:40 AM      Result Value Range   Glucose-Capillary 140 (*) 70 - 99 mg/dL   Comment 1 Documented in Chart     Comment 2 Notify RN    BASIC METABOLIC PANEL     Status: Abnormal   Collection Time    11/14/12  5:00 AM      Result Value Range   Sodium 138  135 - 145 mEq/L   Potassium 3.6  3.5 - 5.1 mEq/L   Chloride 101  96 - 112 mEq/L   CO2 25  19 -  32 mEq/L   Glucose, Bld 136 (*) 70 - 99 mg/dL   BUN 15  6 -  23 mg/dL   Creatinine, Ser 2.95  0.50 - 1.35 mg/dL   Calcium 9.0  8.4 - 28.4 mg/dL   GFR calc non Af Amer 60 (*) >90 mL/min   GFR calc Af Amer 69 (*) >90 mL/min   Comment:            The eGFR has been calculated     using the CKD EPI equation.     This calculation has not been     validated in all clinical     situations.     eGFR's persistently     <90 mL/min signify     possible Chronic Kidney Disease.  GLUCOSE, CAPILLARY     Status: Abnormal   Collection Time    11/14/12  7:43 AM      Result Value Range   Glucose-Capillary 117 (*) 70 - 99 mg/dL  GLUCOSE, CAPILLARY     Status: Abnormal   Collection Time    11/14/12 12:25 PM      Result Value Range   Glucose-Capillary 136 (*) 70 - 99 mg/dL  GLUCOSE, CAPILLARY     Status: Abnormal   Collection Time    11/14/12  4:33 PM      Result Value Range   Glucose-Capillary 111 (*) 70 - 99 mg/dL  GLUCOSE, CAPILLARY     Status: Abnormal   Collection Time    11/14/12  9:30 PM      Result Value Range   Glucose-Capillary 135 (*) 70 - 99 mg/dL  BASIC METABOLIC PANEL     Status: Abnormal   Collection Time    11/15/12  6:09 AM      Result Value Range   Sodium 138  135 - 145 mEq/L   Potassium 3.4 (*) 3.5 - 5.1 mEq/L   Chloride 101  96 - 112 mEq/L   CO2 28  19 - 32 mEq/L   Glucose, Bld 127 (*) 70 - 99 mg/dL   BUN 15  6 - 23 mg/dL   Creatinine, Ser 1.32  0.50 - 1.35 mg/dL   Calcium 8.9  8.4 - 44.0 mg/dL   GFR calc non Af Amer 61 (*) >90 mL/min   GFR calc Af Amer 70 (*) >90 mL/min   Comment:            The eGFR has been calculated     using the CKD EPI equation.     This calculation has not been     validated in all clinical     situations.     eGFR's persistently     <90 mL/min signify     possible Chronic Kidney Disease.  CBC     Status: Abnormal   Collection Time    11/15/12  6:09 AM      Result Value Range   WBC 16.5 (*) 4.0 - 10.5 K/uL   RBC 4.63  4.22 - 5.81 MIL/uL   Hemoglobin 13.6  13.0 - 17.0 g/dL   HCT 10.2  72.5 -  36.6 %   MCV 85.7  78.0 - 100.0 fL   MCH 29.4  26.0 - 34.0 pg   MCHC 34.3  30.0 - 36.0 g/dL   RDW 44.0  34.7 - 42.5 %   Platelets 172  150 - 400 K/uL  GLUCOSE, CAPILLARY     Status: Abnormal   Collection Time  11/15/12  7:50 AM      Result Value Range   Glucose-Capillary 116 (*) 70 - 99 mg/dL  GLUCOSE, CAPILLARY     Status: Abnormal   Collection Time    11/15/12 12:06 PM      Result Value Range   Glucose-Capillary 154 (*) 70 - 99 mg/dL    EKG: NSR, Septal Qs, poor ant RW (old)  IMAGING: Ct Maxillofacial W/cm  11/14/2012  *RADIOLOGY REPORT*  Clinical Data: Status post plastic surgery.  Fever.  Rule out infection.  CT MAXILLOFACIAL WITH CONTRAST  Technique:  Multidetector CT imaging of the maxillofacial structures was performed with intravenous contrast. Multiplanar CT image reconstructions were also generated.  Contrast: 80mL OMNIPAQUE IOHEXOL 300 MG/ML  SOLN  Comparison: Limited CT of the sinuses 04/13/2012.  Findings: Hyperdense supraorbital infiltrates are evident bilaterally.  This is likely related to the patient's recent surgery and may represent injected material.  There are no areas of peripheral enhancement to suggest abscess.  The globes and orbits are intact.  The facial soft tissues are otherwise within normal limits.  The paranasal sinuses and mastoid air cells are clear.  Limited imaging of the brain knee is unremarkable.  IMPRESSION:  1.  Bilateral supraorbital soft tissue densities are likely related to the patient's recent surgery and may represent injected material. 2.  No evidence for abscess.   Original Report Authenticated By: Marin Roberts, M.D.     IMPRESSION: Principal Problem:   Septic shock felt to be secondary to LE cellulitis Active Problems:   AKI (acute kidney injury on admission)- resolving   CAD, ASMI in '97 Rx'd with LAD and staged CFX stenting. CABG X 17 May 2011   Type II or unspecified type diabetes mellitus with unspecified complication,  uncontrolled   Elevated troponin- felt to be secondary to demand ischemia   Abnormal echocardiogram- ? new WMA since 10/31/12 echo   Hyperlipidemia   Sleep apnea, obstructive- on C Pap at home.   Hypokalemia   Hyperglycemia   COPD, moderate   RECOMMENDATION: MD to review echo  Time Spent Directly with Patient: 40 minutes  KILROY,LUKE K 11/15/2012, 1:41 PM   I have seen and evaluated the patient this PM along with Corine Shelter, PA. I agree with his findings &  Examination.  79 y/o WM pt of Dr. Alanda Amass with h/o CABG.  Previously relatively normal Echo ~ 1 month ago with no c/o SSx of Angina or CHF with rest or exertion.  Developed sudden onset Rigors on 5/3 & "passed out", was noted to be in shock - presumed septic shock from cellulitis -- > had AKI & mild troponin leak.   Echo does show "new" septal WMA not see on 10/2012 echo.    I suspect that the troponin elevation was related to hypotension & exisiting CAD.  Perhaps the similar scenario can explain the septal WMA.  He has not had any anginal Sx since admission, nor any arrhythmias.  I cannot determine a particular "event" since his last Echo to explain this change besides the Septic Shock related injury.  With no active Symptoms, ongoing infection & recent AKI, I do not feel that invasive evaluation is indicated. Would opt for non-invasive Nuclear Perfusion Imaging ST that can be performed while in-patient if he will be here for the next few days, or can be ordered as an OP @ [facility] -- we can order, if plan is d/c in the next day or so.  Will also help arrange f/u with Dr. Alanda Amass. Continue ASA, BB & Omega 3 FA (hold off on stain for now -- can be restarted as OP).   Marykay Lex, M.D., M.S. [facility] [address] [place], [place]  [address]  [phone] Pager # [phone] 11/15/2012 5:59 PM

## 2012-11-15 NOTE — Evaluation (Signed)
Physical Therapy Evaluation Patient Details Name: Victor Morgan MRN: [id number] DOB: 1933/02/24 Today's Date: 11/15/2012 Time: 0865-7846 PT Time Calculation (min): 31 min  PT Assessment / Plan / Recommendation Clinical Impression  pt presents with sepsis and L LE cellulitis.  pt moving well and motivated to return to PLOF.  Anticipate with continued mobility pt won't need any further PT f/u.  If pt does not D/C, will check back to ensure safety.      PT Assessment  Patient needs continued PT services    Follow Up Recommendations  No PT follow up;Supervision - Intermittent    Does the patient have the potential to tolerate intense rehabilitation      Barriers to Discharge None      Equipment Recommendations  None recommended by PT    Recommendations for Other Services     Frequency Min 3X/week    Precautions / Restrictions Precautions Precautions: Fall Restrictions Weight Bearing Restrictions: No   Pertinent Vitals/Pain Indicates tight feeling and tender in L anterior lower leg during mobility.        Mobility  Bed Mobility Bed Mobility: Supine to Sit;Sitting - Scoot to Edge of Bed Supine to Sit: 6: Modified independent (Device/Increase time) Sitting - Scoot to Edge of Bed: 6: Modified independent (Device/Increase time) Transfers Transfers: Sit to Stand;Stand to Sit Sit to Stand: 5: Supervision;With upper extremity assist;From bed Stand to Sit: 5: Supervision;With upper extremity assist;To chair/3-in-1;With armrests Details for Transfer Assistance: moves slowly, but demos good use of UEs.   Ambulation/Gait Ambulation/Gait Assistance: 5: Supervision Ambulation Distance (Feet): 250 Feet Assistive device: None Ambulation/Gait Assistance Details: Antalgic gait L LE.  Small LOB requiring pt to take a side step to correct balance, but no A needed.  Balance improved with increased gait.   Gait Pattern: Step-through pattern;Decreased stride length;Antalgic Stairs:  Yes Stairs Assistance: 5: Supervision Stairs Assistance Details (indicate cue type and reason): cues for gait sequencing on stairs.   Stair Management Technique: One rail Left;Forwards Number of Stairs: 5 Wheelchair Mobility Wheelchair Mobility: No    Exercises     PT Diagnosis: Difficulty walking  PT Problem List: Decreased strength;Decreased activity tolerance;Decreased balance;Decreased mobility PT Treatment Interventions: Gait training;Stair training;Functional mobility training;Therapeutic activities;Therapeutic exercise;Balance training;Patient/family education   PT Goals Acute Rehab PT Goals PT Goal Formulation: With patient Time For Goal Achievement: 11/29/12 Potential to Achieve Goals: Good Pt will go Sit to Stand: with modified independence PT Goal: Sit to Stand - Progress: Goal set today Pt will go Stand to Sit: with modified independence PT Goal: Stand to Sit - Progress: Goal set today Pt will Ambulate: >150 feet;with modified independence PT Goal: Ambulate - Progress: Goal set today Pt will Go Up / Down Stairs: Flight;with rail(s);with supervision PT Goal: Up/Down Stairs - Progress: Goal set today  Visit Information  Last PT Received On: 11/15/12 Assistance Needed: +1    Subjective Data  Subjective: I hope I'm going home soon.   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   LLE Sensation: WFL - Light Touch Trunk Assessment Trunk Assessment: Normal   Balance Balance Balance Assessed: Yes Static Standing Balance Static Standing - Balance Support: No upper extremity supported;During functional activity Static Standing - Level of Assistance: 5: Stand by assistance Static Standing - Comment/# of Minutes: pt able to stand at sink for oral hygiene without LOB.    End of Session PT - End of Session Equipment Utilized During Treatment: Gait belt Activity Tolerance: Patient tolerated treatment well Patient left: in chair;with call bell/phone within reach;with family/visitor present Nurse Communication: Mobility status  GP     Sunny Schlein, [place] [phone] 11/15/2012, 10:45 AM

## 2012-11-15 NOTE — Progress Notes (Signed)
PATIENT DETAILS Name: Victor Morgan Age: 79 y.o. Sex: male Date of Birth: 05-30-1933 Admit Date: 11/11/2012 Admitting Physician Nelda Bucks, MD ION:[id number] Sherilyn Cooter, MD  Subjective: Doing better than yesterday-LLE still somewhat erythematous-but better than past few days per patient  Assessment/Plan: Principal Problem: Severe sepsis -unclear source-but could be from developing cellulitis during admission -Lactic acid level stabilized -Blood and urine cultures are negative to date -c/w empiric Vanco and Zosyn  Active Problems: Cellulitis LLE -c/w empiric Vanco and Zosyn for now -B/L LLE dopplers negative for DVT  Acute renal failure  -Secondary to dehydration and sepsis. Patient presented with creatinine of 1.7 a.m. and reached a peak of 2.04  -Patient creatinine is improved to normal after aggressive hydration with IV fluids.  Leukocytosis  -Secondary to the sepsis syndrome. Slowly improving.  Elevated troponin  -Marginally elevated troponins patient denies any chest pain.Do not think this is related to ACS-This is likely secondary to the sepsis/hypotension and the acute renal failure.  However 2 D Echo done on 11/13/12-shows new regional wall motion abnormalities-no seen on prior echo on 10/31/12. -Patient already on aspirin and beta blockers-will get cardiology to see  Diabetes mellitus type 2  -Controlled diabetes mellitus type 2 with A1c of 6.2.  -Metformin held on admission, probably restart on discharge.  -Continue insulin sliding scale and carbohydrate modified diet.   HTN -moderate control with Metoprolol  Disposition: Remain inpatient  DVT Prophylaxis:  Heparin   Code Status: Full code   Family Communication None at bedside  Procedures:  None  CONSULTS:  cardiology and pulmonary/intensive care   MEDICATIONS: Scheduled Meds: . aspirin EC  81 mg Oral BID  . B-complex with vitamin C  1 tablet Oral Daily  . bacitracin-polymyxin b   Both  Eyes BID  . cholecalciferol  2,000 Units Oral Daily  . fluticasone  2 spray Each Nare BID  . heparin  5,000 Units Subcutaneous Q8H  . insulin aspart  0-9 Units Subcutaneous TID WC  . metoprolol tartrate  50 mg Oral BID  . multivitamin-lutein  1 capsule Oral Daily  . omega-3 acid ethyl esters  1 g Oral Daily  . pantoprazole  40 mg Oral Daily  . piperacillin-tazobactam (ZOSYN)  IV  3.375 g Intravenous Q8H  . pneumococcal 23 valent vaccine  0.5 mL Intramuscular Tomorrow-1000  . prednisoLONE acetate  1 drop Left Eye Daily  . triamcinolone cream  1 application Topical BID  . vancomycin  1,000 mg Intravenous Q12H  . ascorbic acid  1,000 mg Oral Daily  . vitamin E  800 Units Oral Daily   Continuous Infusions: . sodium chloride 1,000 mL (11/13/12 1900)   PRN Meds:.sodium chloride, acetaminophen, Ipratropium-Albuterol, sodium chloride  Antibiotics: Anti-infectives   Start     Dose/Rate Route Frequency Ordered Stop   11/15/12 0100  vancomycin (VANCOCIN) IVPB 1000 mg/200 mL premix     1,000 mg 200 mL/hr over 60 Minutes Intravenous Every 12 hours 11/14/12 1150     11/14/12 1200  vancomycin (VANCOCIN) 2,000 mg in sodium chloride 0.9 % 500 mL IVPB     2,000 mg 250 mL/hr over 120 Minutes Intravenous  Once 11/14/12 1150 11/14/12 1526   11/12/12 2200  vancomycin (VANCOCIN) IVPB 1000 mg/200 mL premix  Status:  Discontinued     1,000 mg 200 mL/hr over 60 Minutes Intravenous Every 24 hours 11/12/12 0247 11/13/12 1049   11/12/12 1000  piperacillin-tazobactam (ZOSYN) IVPB 3.375 g     3.375 g 12.5 mL/hr over 240  Minutes Intravenous Every 8 hours 11/12/12 0247 11/19/12 2359   11/12/12 0400  vancomycin (VANCOCIN) IVPB 1000 mg/200 mL premix     1,000 mg 200 mL/hr over 60 Minutes Intravenous  Once 11/12/12 0232 11/12/12 0542   11/12/12 0300  piperacillin-tazobactam (ZOSYN) IVPB 3.375 g     3.375 g 100 mL/hr over 30 Minutes Intravenous  Once 11/12/12 0232 11/12/12 0339   11/12/12 0000  vancomycin  (VANCOCIN) IVPB 1000 mg/200 mL premix     1,000 mg 200 mL/hr over 60 Minutes Intravenous  Once 11/11/12 2348 11/12/12 0204   11/11/12 2245  cefTRIAXone (ROCEPHIN) 1 g in dextrose 5 % 50 mL IVPB     1 g 100 mL/hr over 30 Minutes Intravenous  Once 11/11/12 2238 11/11/12 2332   11/11/12 2245  azithromycin (ZITHROMAX) 500 mg in dextrose 5 % 250 mL IVPB     500 mg 250 mL/hr over 60 Minutes Intravenous  Once 11/11/12 2238 11/12/12 0107       PHYSICAL EXAM: Vital signs in last 24 hours: Filed Vitals:   11/14/12 1448 11/14/12 2145 11/15/12 0426 11/15/12 0948  BP: 142/68 160/77 153/86 154/63  Pulse: 69 76 69 70  Temp: 97.7 F (36.5 C) 98.2 F (36.8 C) 97.9 F (36.6 C)   TempSrc: Oral Oral Oral   Resp: 20 18 20    Height:      Weight:   104 kg (229 lb 4.5 oz)   SpO2: 97% 91% 96%     Weight change: -0.3 kg (-10.6 oz) Filed Weights   11/13/12 2035 11/14/12 0435 11/15/12 0426  Weight: 104.3 kg (229 lb 15 oz) 104.4 kg (230 lb 2.6 oz) 104 kg (229 lb 4.5 oz)   Body mass index is 31.99 kg/(m^2).   Gen Exam: Awake and alert with clear speech.   Neck: Supple, No JVD.   Chest: B/L Clear.   CVS: S1 S2 Regular, no murmurs.  Abdomen: soft, BS +, non tender, non distended.  Extremities: no edema, lower extremities warm to touch.LLE erythematous and mild swelling-but decreased from marked area Neurologic: Non Focal.   Skin: No Rash.   Wounds: N/A.    Intake/Output from previous day:  Intake/Output Summary (Last 24 hours) at 11/15/12 1250 Last data filed at 11/15/12 0900  Gross per 24 hour  Intake 1299.83 ml  Output   3000 ml  Net -1700.17 ml     LAB RESULTS: CBC  Recent Labs Lab 11/11/12 2058 11/12/12 0300 11/12/12 1610 11/13/12 0500 11/15/12 0609  WBC 16.7* 20.8* 21.6* 19.9* 16.5*  HGB 16.4 13.1 13.1 12.6* 13.6  HCT 46.8 38.5* 37.9* 37.2* 39.7  PLT 196 175 153 139* 172  MCV 86.5 86.3 85.9 86.1 85.7  MCH 30.3 29.4 29.7 29.2 29.4  MCHC 35.0 34.0 34.6 33.9 34.3  RDW  14.3 14.6 14.8 14.8 14.5  LYMPHSABS 1.0  --   --   --   --   MONOABS 0.8  --   --   --   --   EOSABS 0.1  --   --   --   --   BASOSABS 0.0  --   --   --   --     Chemistries   Recent Labs Lab 11/12/12 0300 11/12/12 1610 11/13/12 0500 11/14/12 0500 11/15/12 0609  NA 139 136 140 138 138  K 3.6 4.1 3.8 3.6 3.4*  CL 104 103 107 101 101  CO2 16* 20 20 25 28   GLUCOSE 157* 127* 131* 136*  127*  BUN 36* 28* 21 15 15   CREATININE 2.04* 1.51* 1.31 1.15 1.13  CALCIUM 7.8* 8.0* 8.0* 9.0 8.9  MG 0.8* 2.0 2.0  --   --     CBG:  Recent Labs Lab 11/14/12 1225 11/14/12 1633 11/14/12 2130 11/15/12 0750 11/15/12 1206  GLUCAP 136* 111* 135* 116* 154*    GFR Estimated Creatinine Clearance: 67.2 ml/min (by C-G formula based on Cr of 1.13).  Coagulation profile  Recent Labs Lab 11/12/12 0300  INR 1.22    Cardiac Enzymes  Recent Labs Lab 11/12/12 0233 11/12/12 0833 11/12/12 1433  TROPONINI 0.48* 0.40* 0.46*    No components found with this basename: POCBNP,  No results found for this basename: DDIMER,  in the last 72 hours No results found for this basename: HGBA1C,  in the last 72 hours No results found for this basename: CHOL, HDL, LDLCALC, TRIG, CHOLHDL, LDLDIRECT,  in the last 72 hours No results found for this basename: TSH, T4TOTAL, FREET3, T3FREE, THYROIDAB,  in the last 72 hours No results found for this basename: VITAMINB12, FOLATE, FERRITIN, TIBC, IRON, RETICCTPCT,  in the last 72 hours No results found for this basename: LIPASE, AMYLASE,  in the last 72 hours  Urine Studies No results found for this basename: UACOL, UAPR, USPG, UPH, UTP, UGL, UKET, UBIL, UHGB, UNIT, UROB, ULEU, UEPI, UWBC, URBC, UBAC, CAST, CRYS, UCOM, BILUA,  in the last 72 hours  MICROBIOLOGY: Recent Results (from the past 240 hour(s))  CULTURE, BLOOD (ROUTINE X 2)     Status: None   Collection Time    11/11/12 10:25 PM      Result Value Range Status   Specimen Description BLOOD RIGHT  ARM   Final   Special Requests BOTTLES DRAWN AEROBIC AND ANAEROBIC 10CC EA   Final   Culture  Setup Time 11/12/2012 04:25   Final   Culture     Final   Value:        BLOOD CULTURE RECEIVED NO GROWTH TO DATE CULTURE WILL BE HELD FOR 5 DAYS BEFORE ISSUING A FINAL NEGATIVE REPORT   Report Status PENDING   Incomplete  CULTURE, BLOOD (ROUTINE X 2)     Status: None   Collection Time    11/11/12 10:35 PM      Result Value Range Status   Specimen Description BLOOD RIGHT HAND   Final   Special Requests BOTTLES DRAWN AEROBIC ONLY 10CC   Final   Culture  Setup Time 11/12/2012 04:25   Final   Culture     Final   Value:        BLOOD CULTURE RECEIVED NO GROWTH TO DATE CULTURE WILL BE HELD FOR 5 DAYS BEFORE ISSUING A FINAL NEGATIVE REPORT   Report Status PENDING   Incomplete  URINE CULTURE     Status: None   Collection Time    11/11/12 11:48 PM      Result Value Range Status   Specimen Description URINE, CATHETERIZED   Final   Special Requests NONE   Final   Culture  Setup Time 11/12/2012 12:50   Final   Colony Count NO GROWTH   Final   Culture NO GROWTH   Final   Report Status 11/13/2012 FINAL   Final  MRSA PCR SCREENING     Status: None   Collection Time    11/12/12  1:55 AM      Result Value Range Status   MRSA by PCR NEGATIVE  NEGATIVE Final  Comment:            The GeneXpert MRSA Assay (FDA     approved for NASAL specimens     only), is one component of a     comprehensive MRSA colonization     surveillance program. It is not     intended to diagnose MRSA     infection nor to guide or     monitor treatment for     MRSA infections.  URINE CULTURE     Status: None   Collection Time    11/12/12  5:24 AM      Result Value Range Status   Specimen Description URINE, CLEAN CATCH   Final   Special Requests NONE   Final   Culture  Setup Time 11/12/2012 12:50   Final   Colony Count NO GROWTH   Final   Culture NO GROWTH   Final   Report Status 11/13/2012 FINAL   Final    RADIOLOGY  STUDIES/RESULTS: Dg Chest 2 View  11/11/2012  *RADIOLOGY REPORT*  Clinical Data: Body aches, fever  CHEST - 2 VIEW  Comparison: 04/03/2012  Findings: Mild right hemidiaphragm elevation.  Mild bibasilar opacities.  Status post median sternotomy and CABG.  Heart size upper normal.  Mediastinal contours otherwise within normal range. Hypoaeration with interstitial and vascular crowding.  No pleural effusion or pneumothorax.  No acute osseous finding.  IMPRESSION: Mild bibasilar opacities, favored to reflect atelectasis.   Original Report Authenticated By: Jearld Lesch, M.D.    US Renal Port  11/12/2012  *RADIOLOGY REPORT*  Clinical Data: Acute renal insufficiency, diabetes, hypertension  RENAL/URINARY TRACT ULTRASOUND COMPLETE  Comparison:  None.  Findings:  Right Kidney:  Measures 14.5 cm.  Normal echogenicity and cortical thickness.  No hydronephrosis.  Right kidney lower pole demonstrates a 4.5 x 4.8 x 3.7 cm hypoechoic minimally septated cyst.  No other acute finding.  Left Kidney:  14.4 cm length.  Normal cortex and echogenicity. Left upper pole hypoechoic renal cyst measures 2.9 x 3.3 x 2.4 cm. Small exophytic mid pole hypoechoic cyst measures 13 mm.  No hydronephrosis or acute finding.  Bladder:  Not visualized.  Accessory splenule noted in the left upper quadrant.  No abdominal free fluid  IMPRESSION: Negative for hydronephrosis.  Bilateral renal cysts   Original Report Authenticated By: Judie Petit. Miles Costain, M.D.    Ct Maxillofacial W/cm  11/14/2012  *RADIOLOGY REPORT*  Clinical Data: Status post plastic surgery.  Fever.  Rule out infection.  CT MAXILLOFACIAL WITH CONTRAST  Technique:  Multidetector CT imaging of the maxillofacial structures was performed with intravenous contrast. Multiplanar CT image reconstructions were also generated.  Contrast: 80mL OMNIPAQUE IOHEXOL 300 MG/ML  SOLN  Comparison: Limited CT of the sinuses 04/13/2012.  Findings: Hyperdense supraorbital infiltrates are evident bilaterally.   This is likely related to the patient's recent surgery and may represent injected material.  There are no areas of peripheral enhancement to suggest abscess.  The globes and orbits are intact.  The facial soft tissues are otherwise within normal limits.  The paranasal sinuses and mastoid air cells are clear.  Limited imaging of the brain knee is unremarkable.  IMPRESSION:  1.  Bilateral supraorbital soft tissue densities are likely related to the patient's recent surgery and may represent injected material. 2.  No evidence for abscess.   Original Report Authenticated By: Marin Roberts, M.D.     Jeoffrey Massed, MD  [facility] Pager:[phone]  If 7PM-7AM, please contact night-coverage [email or web address]  Password [email or web address] 11/15/2012, 12:50 PM   LOS: 4 days

## 2012-11-16 ENCOUNTER — Other Ambulatory Visit: Payer: Self-pay | Admitting: Cardiology

## 2012-11-16 DIAGNOSIS — E118 Type 2 diabetes mellitus with unspecified complications: Secondary | ICD-10-CM

## 2012-11-16 DIAGNOSIS — I248 Other forms of acute ischemic heart disease: Secondary | ICD-10-CM

## 2012-11-16 DIAGNOSIS — I2510 Atherosclerotic heart disease of native coronary artery without angina pectoris: Secondary | ICD-10-CM

## 2012-11-16 LAB — CBC
HCT: 38.8 % — ABNORMAL LOW (ref 39.0–52.0)
Hemoglobin: 13.6 g/dL (ref 13.0–17.0)
MCV: 84.9 fL (ref 78.0–100.0)
RBC: 4.57 MIL/uL (ref 4.22–5.81)
RDW: 14.2 % (ref 11.5–15.5)
WBC: 11.6 10*3/uL — ABNORMAL HIGH (ref 4.0–10.5)

## 2012-11-16 LAB — GLUCOSE, CAPILLARY
Glucose-Capillary: 174 mg/dL — ABNORMAL HIGH (ref 70–99)
Glucose-Capillary: 121 mg/dL — ABNORMAL HIGH (ref 70–99)
Glucose-Capillary: 165 mg/dL — ABNORMAL HIGH (ref 70–99)

## 2012-11-16 LAB — BASIC METABOLIC PANEL
BUN: 16 mg/dL (ref 6–23)
CO2: 23 mEq/L (ref 19–32)
Chloride: 102 mEq/L (ref 96–112)
Creatinine, Ser: 1.02 mg/dL (ref 0.50–1.35)
GFR calc Af Amer: 80 mL/min — ABNORMAL LOW (ref >90)
Potassium: 3.6 mEq/L (ref 3.5–5.1)

## 2012-11-16 NOTE — Progress Notes (Signed)
PATIENT DETAILS Name: Victor Morgan Age: 79 y.o. Sex: male Date of Birth: 20-Dec-1932 Admit Date: 11/11/2012 Admitting Physician Nelda Bucks, MD ZOX:[id number] Sherilyn Cooter, MD  Subjective: No major complaints-LLE still somewhat erythematous  Assessment/Plan: Principal Problem: Severe sepsis -unclear source-but could be from developing cellulitis during admission -Lactic acid level stabilized -Blood and urine cultures are negative to date -c/w empiric Vanco and Zosyn  Active Problems: Cellulitis LLE -c/w empiric Vanco and Zosyn for now -B/L LLE dopplers negative for DVT  Acute renal failure  -Secondary to dehydration and sepsis. Patient presented with creatinine of 1.7 a.m. and reached a peak of 2.04  -Patient creatinine is improved to normal after aggressive hydration with IV fluids.  Leukocytosis  -Secondary to the sepsis syndrome. Slowly improving-almost back to normal  Elevated troponin  -Marginally elevated troponins patient denies any chest pain.Do not think this is related to ACS-This is likely secondary to the sepsis/hypotension and the acute renal failure.  However 2 D Echo done on 11/13/12-shows new regional wall motion abnormalities-no seen on prior echo on 10/31/12. -Patient already on aspirin and beta blockers -appreciate cards input-would plan on outpatient stress testing  Diabetes mellitus type 2  -Controlled diabetes mellitus type 2 with A1c of 6.2.  -Metformin held on admission, probably restart on discharge.  -Continue insulin sliding scale and carbohydrate modified diet.   HTN -moderate control with Metoprolol  Disposition: Remain inpatient-home 5/9  DVT Prophylaxis:  Heparin   Code Status: Full code   Family Communication None at bedside  Procedures:  None  CONSULTS:  cardiology and pulmonary/intensive care   MEDICATIONS: Scheduled Meds: . aspirin EC  81 mg Oral BID  . B-complex with vitamin C  1 tablet Oral Daily  .  bacitracin-polymyxin b   Both Eyes BID  . cholecalciferol  2,000 Units Oral Daily  . fluticasone  2 spray Each Nare BID  . heparin  5,000 Units Subcutaneous Q8H  . insulin aspart  0-9 Units Subcutaneous TID WC  . metoprolol tartrate  50 mg Oral BID  . multivitamin-lutein  1 capsule Oral Daily  . omega-3 acid ethyl esters  1 g Oral Daily  . pantoprazole  40 mg Oral Daily  . piperacillin-tazobactam (ZOSYN)  IV  3.375 g Intravenous Q8H  . prednisoLONE acetate  1 drop Left Eye Daily  . triamcinolone cream  1 application Topical BID  . vancomycin  1,000 mg Intravenous Q12H  . ascorbic acid  1,000 mg Oral Daily  . vitamin E  800 Units Oral Daily   Continuous Infusions: . sodium chloride 1,000 mL (11/13/12 1900)   PRN Meds:.sodium chloride, acetaminophen, Ipratropium-Albuterol, sodium chloride  Antibiotics: Anti-infectives   Start     Dose/Rate Route Frequency Ordered Stop   11/15/12 0100  vancomycin (VANCOCIN) IVPB 1000 mg/200 mL premix     1,000 mg 200 mL/hr over 60 Minutes Intravenous Every 12 hours 11/14/12 1150     11/14/12 1200  vancomycin (VANCOCIN) 2,000 mg in sodium chloride 0.9 % 500 mL IVPB     2,000 mg 250 mL/hr over 120 Minutes Intravenous  Once 11/14/12 1150 11/14/12 1526   11/12/12 2200  vancomycin (VANCOCIN) IVPB 1000 mg/200 mL premix  Status:  Discontinued     1,000 mg 200 mL/hr over 60 Minutes Intravenous Every 24 hours 11/12/12 0247 11/13/12 1049   11/12/12 1000  piperacillin-tazobactam (ZOSYN) IVPB 3.375 g     3.375 g 12.5 mL/hr over 240 Minutes Intravenous Every 8 hours 11/12/12 0247 11/20/12 0359  11/12/12 0400  vancomycin (VANCOCIN) IVPB 1000 mg/200 mL premix     1,000 mg 200 mL/hr over 60 Minutes Intravenous  Once 11/12/12 0232 11/12/12 0542   11/12/12 0300  piperacillin-tazobactam (ZOSYN) IVPB 3.375 g     3.375 g 100 mL/hr over 30 Minutes Intravenous  Once 11/12/12 0232 11/12/12 0339   11/12/12 0000  vancomycin (VANCOCIN) IVPB 1000 mg/200 mL premix      1,000 mg 200 mL/hr over 60 Minutes Intravenous  Once 11/11/12 2348 11/12/12 0204   11/11/12 2245  cefTRIAXone (ROCEPHIN) 1 g in dextrose 5 % 50 mL IVPB     1 g 100 mL/hr over 30 Minutes Intravenous  Once 11/11/12 2238 11/11/12 2332   11/11/12 2245  azithromycin (ZITHROMAX) 500 mg in dextrose 5 % 250 mL IVPB     500 mg 250 mL/hr over 60 Minutes Intravenous  Once 11/11/12 2238 11/12/12 0107       PHYSICAL EXAM: Vital signs in last 24 hours: Filed Vitals:   11/15/12 1326 11/15/12 2113 11/16/12 0535 11/16/12 0938  BP: 151/77 163/76 143/74 162/62  Pulse: 66 72 64 70  Temp: 97.6 F (36.4 C) 98.9 F (37.2 C) 98.3 F (36.8 C)   TempSrc: Oral Oral Oral   Resp: 20 18 18    Height:      Weight:      SpO2: 96% 92% 93%     Weight change:  Filed Weights   11/13/12 2035 11/14/12 0435 11/15/12 0426  Weight: 104.3 kg (229 lb 15 oz) 104.4 kg (230 lb 2.6 oz) 104 kg (229 lb 4.5 oz)   Body mass index is 31.99 kg/(m^2).   Gen Exam: Awake and alert with clear speech.   Neck: Supple, No JVD.   Chest: B/L Clear.   CVS: S1 S2 Regular, no murmurs.  Abdomen: soft, BS +, non tender, non distended.  Extremities: no edema, lower extremities warm to touch.LLE erythematous and mild swelling-but decreased from marked area Neurologic: Non Focal.   Skin: No Rash.   Wounds: N/A.    Intake/Output from previous day:  Intake/Output Summary (Last 24 hours) at 11/16/12 1009 Last data filed at 11/16/12 0900  Gross per 24 hour  Intake    600 ml  Output   1750 ml  Net  -1150 ml     LAB RESULTS: CBC  Recent Labs Lab 11/11/12 2058 11/12/12 0300 11/12/12 1610 11/13/12 0500 11/15/12 0609 11/16/12 0425  WBC 16.7* 20.8* 21.6* 19.9* 16.5* 11.6*  HGB 16.4 13.1 13.1 12.6* 13.6 13.6  HCT 46.8 38.5* 37.9* 37.2* 39.7 38.8*  PLT 196 175 153 139* 172 171  MCV 86.5 86.3 85.9 86.1 85.7 84.9  MCH 30.3 29.4 29.7 29.2 29.4 29.8  MCHC 35.0 34.0 34.6 33.9 34.3 35.1  RDW 14.3 14.6 14.8 14.8 14.5 14.2   LYMPHSABS 1.0  --   --   --   --   --   MONOABS 0.8  --   --   --   --   --   EOSABS 0.1  --   --   --   --   --   BASOSABS 0.0  --   --   --   --   --     Chemistries   Recent Labs Lab 11/12/12 0300 11/12/12 1610 11/13/12 0500 11/14/12 0500 11/15/12 0609 11/16/12 0425  NA 139 136 140 138 138 140  K 3.6 4.1 3.8 3.6 3.4* 3.6  CL 104 103 107 101 101 102  CO2 16* 20 20 25 28 23   GLUCOSE 157* 127* 131* 136* 127* 142*  BUN 36* 28* 21 15 15 16   CREATININE 2.04* 1.51* 1.31 1.15 1.13 1.02  CALCIUM 7.8* 8.0* 8.0* 9.0 8.9 8.8  MG 0.8* 2.0 2.0  --   --   --     CBG:  Recent Labs Lab 11/15/12 0750 11/15/12 1206 11/15/12 1647 11/15/12 2119 11/16/12 0739  GLUCAP 116* 154* 106* 193* 174*    GFR Estimated Creatinine Clearance: 74.5 ml/min (by C-G formula based on Cr of 1.02).  Coagulation profile  Recent Labs Lab 11/12/12 0300  INR 1.22    Cardiac Enzymes  Recent Labs Lab 11/12/12 0233 11/12/12 0833 11/12/12 1433  TROPONINI 0.48* 0.40* 0.46*    No components found with this basename: POCBNP,  No results found for this basename: DDIMER,  in the last 72 hours No results found for this basename: HGBA1C,  in the last 72 hours No results found for this basename: CHOL, HDL, LDLCALC, TRIG, CHOLHDL, LDLDIRECT,  in the last 72 hours No results found for this basename: TSH, T4TOTAL, FREET3, T3FREE, THYROIDAB,  in the last 72 hours No results found for this basename: VITAMINB12, FOLATE, FERRITIN, TIBC, IRON, RETICCTPCT,  in the last 72 hours No results found for this basename: LIPASE, AMYLASE,  in the last 72 hours  Urine Studies No results found for this basename: UACOL, UAPR, USPG, UPH, UTP, UGL, UKET, UBIL, UHGB, UNIT, UROB, ULEU, UEPI, UWBC, URBC, UBAC, CAST, CRYS, UCOM, BILUA,  in the last 72 hours  MICROBIOLOGY: Recent Results (from the past 240 hour(s))  CULTURE, BLOOD (ROUTINE X 2)     Status: None   Collection Time    11/11/12 10:25 PM      Result Value  Range Status   Specimen Description BLOOD RIGHT ARM   Final   Special Requests BOTTLES DRAWN AEROBIC AND ANAEROBIC 10CC EA   Final   Culture  Setup Time 11/12/2012 04:25   Final   Culture     Final   Value:        BLOOD CULTURE RECEIVED NO GROWTH TO DATE CULTURE WILL BE HELD FOR 5 DAYS BEFORE ISSUING A FINAL NEGATIVE REPORT   Report Status PENDING   Incomplete  CULTURE, BLOOD (ROUTINE X 2)     Status: None   Collection Time    11/11/12 10:35 PM      Result Value Range Status   Specimen Description BLOOD RIGHT HAND   Final   Special Requests BOTTLES DRAWN AEROBIC ONLY 10CC   Final   Culture  Setup Time 11/12/2012 04:25   Final   Culture     Final   Value:        BLOOD CULTURE RECEIVED NO GROWTH TO DATE CULTURE WILL BE HELD FOR 5 DAYS BEFORE ISSUING A FINAL NEGATIVE REPORT   Report Status PENDING   Incomplete  URINE CULTURE     Status: None   Collection Time    11/11/12 11:48 PM      Result Value Range Status   Specimen Description URINE, CATHETERIZED   Final   Special Requests NONE   Final   Culture  Setup Time 11/12/2012 12:50   Final   Colony Count NO GROWTH   Final   Culture NO GROWTH   Final   Report Status 11/13/2012 FINAL   Final  MRSA PCR SCREENING     Status: None   Collection Time    11/12/12  1:55  AM      Result Value Range Status   MRSA by PCR NEGATIVE  NEGATIVE Final   Comment:            The GeneXpert MRSA Assay (FDA     approved for NASAL specimens     only), is one component of a     comprehensive MRSA colonization     surveillance program. It is not     intended to diagnose MRSA     infection nor to guide or     monitor treatment for     MRSA infections.  URINE CULTURE     Status: None   Collection Time    11/12/12  5:24 AM      Result Value Range Status   Specimen Description URINE, CLEAN CATCH   Final   Special Requests NONE   Final   Culture  Setup Time 11/12/2012 12:50   Final   Colony Count NO GROWTH   Final   Culture NO GROWTH   Final   Report  Status 11/13/2012 FINAL   Final    RADIOLOGY STUDIES/RESULTS: Dg Chest 2 View  11/11/2012  *RADIOLOGY REPORT*  Clinical Data: Body aches, fever  CHEST - 2 VIEW  Comparison: 04/03/2012  Findings: Mild right hemidiaphragm elevation.  Mild bibasilar opacities.  Status post median sternotomy and CABG.  Heart size upper normal.  Mediastinal contours otherwise within normal range. Hypoaeration with interstitial and vascular crowding.  No pleural effusion or pneumothorax.  No acute osseous finding.  IMPRESSION: Mild bibasilar opacities, favored to reflect atelectasis.   Original Report Authenticated By: Jearld Lesch, M.D.    US Renal Port  11/12/2012  *RADIOLOGY REPORT*  Clinical Data: Acute renal insufficiency, diabetes, hypertension  RENAL/URINARY TRACT ULTRASOUND COMPLETE  Comparison:  None.  Findings:  Right Kidney:  Measures 14.5 cm.  Normal echogenicity and cortical thickness.  No hydronephrosis.  Right kidney lower pole demonstrates a 4.5 x 4.8 x 3.7 cm hypoechoic minimally septated cyst.  No other acute finding.  Left Kidney:  14.4 cm length.  Normal cortex and echogenicity. Left upper pole hypoechoic renal cyst measures 2.9 x 3.3 x 2.4 cm. Small exophytic mid pole hypoechoic cyst measures 13 mm.  No hydronephrosis or acute finding.  Bladder:  Not visualized.  Accessory splenule noted in the left upper quadrant.  No abdominal free fluid  IMPRESSION: Negative for hydronephrosis.  Bilateral renal cysts   Original Report Authenticated By: Judie Petit. Miles Costain, M.D.    Ct Maxillofacial W/cm  11/14/2012  *RADIOLOGY REPORT*  Clinical Data: Status post plastic surgery.  Fever.  Rule out infection.  CT MAXILLOFACIAL WITH CONTRAST  Technique:  Multidetector CT imaging of the maxillofacial structures was performed with intravenous contrast. Multiplanar CT image reconstructions were also generated.  Contrast: 80mL OMNIPAQUE IOHEXOL 300 MG/ML  SOLN  Comparison: Limited CT of the sinuses 04/13/2012.  Findings: Hyperdense  supraorbital infiltrates are evident bilaterally.  This is likely related to the patient's recent surgery and may represent injected material.  There are no areas of peripheral enhancement to suggest abscess.  The globes and orbits are intact.  The facial soft tissues are otherwise within normal limits.  The paranasal sinuses and mastoid air cells are clear.  Limited imaging of the brain knee is unremarkable.  IMPRESSION:  1.  Bilateral supraorbital soft tissue densities are likely related to the patient's recent surgery and may represent injected material. 2.  No evidence for abscess.   Original Report Authenticated By: Cristal Deer  Alfredo Batty, M.D.     Jeoffrey Massed, MD  [facility] Pager:[phone]  If 7PM-7AM, please contact night-coverage [email or web address] Password [email or web address] 11/16/2012, 10:09 AM   LOS: 5 days

## 2012-11-16 NOTE — Progress Notes (Signed)
79 y.o. male with a past medical history significant for CAD. He had an MI in 1997 Rx'd with LAD stenting X 2 followed by staged CGF stenting. He ultimately had a CABG X 17 May 2011 with L-LAD, S-Dx1, S-OM2, S-AM and PDA. He just saw Dr Alanda Amass in the office 10/13/12. An echo was done that showed an EF of 50-55%. The pt was admitted 11/11/12 with MS changes, hypotension, and acute renal injury, felt to be secondary to septic shock. This was felt to be from Lt leg cellulitis. During this admission Troponin's were elevated 0.4-0.48. An echo was done 11/13/12 that showed an EF of 40-50% with multiple WMA not mentioned on previous echo. The pt denies any chest pain history. His renal function and B/P are now stable.   Subjective: No complaints.  "Today is our 54th wedding anniversary."   Objective: Vital signs in last 24 hours: Temp:  [97.6 F (36.4 C)-98.9 F (37.2 C)] 98.3 F (36.8 C) (05/08 0535) Pulse Rate:  [64-72] 70 (05/08 0938) Resp:  [18-20] 18 (05/08 0535) BP: (143-163)/(62-77) 162/62 mmHg (05/08 0938) SpO2:  [92 %-96 %] 93 % (05/08 0535) Weight change:  Last BM Date: 11/15/12 Intake/Output from previous day: -1150 05/07 0701 - 05/08 0700 In: 600 [P.O.:600] Out: 1750 [Urine:1750] Intake/Output this shift: Total I/O In: 240 [P.O.:240] Out: 800 [Urine:800]  PE: General:NAD, pleasant affect HEENT:  Facial bruising from recent eyebrow surgery Heart:S1S2 RRR Lungs:clear with occ crackle, no wheezes Abd:+ BS, soft, non tender Ext:no edema of rt leg, lt bright red, very warm to touch , + edema Neuro:alert and oriented X 3   Lab Results:  Recent Labs  11/15/12 0609 11/16/12 0425  WBC 16.5* 11.6*  HGB 13.6 13.6  HCT 39.7 38.8*  PLT 172 171   BMET  Recent Labs  11/15/12 0609 11/16/12 0425  NA 138 140  K 3.4* 3.6  CL 101 102  CO2 28 23  GLUCOSE 127* 142*  BUN 15 16  CREATININE 1.13 1.02  CALCIUM 8.9 8.8   No results found for this basename: TROPONINI, CK, MB,  in the  last 72 hours  No results found for this basename: CHOL, HDL, LDLCALC, LDLDIRECT, TRIG, CHOLHDL   Lab Results  Component Value Date   HGBA1C 6.2* 11/12/2012     No results found for this basename: TSH    Hepatic Function Panel No results found for this basename: PROT, ALBUMIN, AST, ALT, ALKPHOS, BILITOT, BILIDIR, IBILI,  in the last 72 hours No results found for this basename: CHOL,  in the last 72 hours No results found for this basename: PROTIME,  in the last 72 hours    EKG: Orders placed during the hospital encounter of 11/11/12  . EKG 12-LEAD  . EKG 12-LEAD  . EKG    Studies/Results: Ct Maxillofacial W/cm  11/14/2012  *RADIOLOGY REPORT*  Clinical Data: Status post plastic surgery.  Fever.  Rule out infection.  CT MAXILLOFACIAL WITH CONTRAST  Technique:  Multidetector CT imaging of the maxillofacial structures was performed with intravenous contrast. Multiplanar CT image reconstructions were also generated.  Contrast: 80mL OMNIPAQUE IOHEXOL 300 MG/ML  SOLN  Comparison: Limited CT of the sinuses 04/13/2012.  Findings: Hyperdense supraorbital infiltrates are evident bilaterally.  This is likely related to the patient's recent surgery and may represent injected material.  There are no areas of peripheral enhancement to suggest abscess.  The globes and orbits are intact.  The facial soft tissues are otherwise within normal limits.  The  paranasal sinuses and mastoid air cells are clear.  Limited imaging of the brain knee is unremarkable.  IMPRESSION:  1.  Bilateral supraorbital soft tissue densities are likely related to the patient's recent surgery and may represent injected material. 2.  No evidence for abscess.   Original Report Authenticated By: Marin Roberts, M.D.     Medications: I have reviewed the patient's current medications. Scheduled Meds: . aspirin EC  81 mg Oral BID  . B-complex with vitamin C  1 tablet Oral Daily  . bacitracin-polymyxin b   Both Eyes BID  .  cholecalciferol  2,000 Units Oral Daily  . fluticasone  2 spray Each Nare BID  . heparin  5,000 Units Subcutaneous Q8H  . insulin aspart  0-9 Units Subcutaneous TID WC  . metoprolol tartrate  50 mg Oral BID  . multivitamin-lutein  1 capsule Oral Daily  . omega-3 acid ethyl esters  1 g Oral Daily  . pantoprazole  40 mg Oral Daily  . piperacillin-tazobactam (ZOSYN)  IV  3.375 g Intravenous Q8H  . prednisoLONE acetate  1 drop Left Eye Daily  . triamcinolone cream  1 application Topical BID  . vancomycin  1,000 mg Intravenous Q12H  . ascorbic acid  1,000 mg Oral Daily  . vitamin E  800 Units Oral Daily   Continuous Infusions: . sodium chloride 1,000 mL (11/13/12 1900)   PRN Meds:.sodium chloride, acetaminophen, Ipratropium-Albuterol, sodium chloride  Assessment/Plan: Principal Problem:   Septic shock felt to be secondary to LE cellulitis Active Problems:   Hyperlipidemia   Sleep apnea, obstructive- on C Pap at home.   AKI (acute kidney injury on admission)- resolving   Hypokalemia   Hyperglycemia   CAD, ASMI in '97 Rx'd with LAD and staged CFX stenting. CABG X 17 May 2011   COPD, moderate   Type II or unspecified type diabetes mellitus with unspecified complication, uncontrolled   Elevated troponin- felt to be secondary to demand ischemia   Abnormal echocardiogram- ? new WMA since 10/31/12 echo  PLAN:  Pt to be discharged tomorrow, will plan lexiscan myoview next week then follow up with Dr. Alanda Amass.    LOS: 5 days   Time spent with pt. :15 minutes. [facility] R  Nurse Practitioner Certified Pager [phone] 11/16/2012, 12:03 PM    Patient seen and examined. Agree with assessment and plan. Feeling better. WBC and Cr  Improved. Left leg warm to touch; erythema and edema persist. Will plan for outpatient nuclear myocardial perfusion study next week in office.   Lennette Bihari, MD, [facility] 11/16/2012 2:35 PM

## 2012-11-17 DIAGNOSIS — R6521 Severe sepsis with septic shock: Secondary | ICD-10-CM

## 2012-11-17 DIAGNOSIS — A419 Sepsis, unspecified organism: Secondary | ICD-10-CM

## 2012-11-17 DIAGNOSIS — R6520 Severe sepsis without septic shock: Secondary | ICD-10-CM

## 2012-11-17 LAB — GLUCOSE, CAPILLARY
Glucose-Capillary: 125 mg/dL — ABNORMAL HIGH (ref 70–99)
Glucose-Capillary: 125 mg/dL — ABNORMAL HIGH (ref 70–99)

## 2012-11-17 MED ORDER — CLINDAMYCIN HCL 300 MG PO CAPS: 300.0000 mg | ORAL_CAPSULE | Freq: Four times a day (QID) | ORAL | Status: DC

## 2012-11-17 MED ORDER — CLINDAMYCIN HCL 300 MG PO CAPS
300.0000 mg | ORAL_CAPSULE | Freq: Four times a day (QID) | ORAL | Status: DC
  Administered 2012-11-17: 300 mg via ORAL
  Filled 2012-11-17 (×4): qty 1

## 2012-11-17 NOTE — Progress Notes (Signed)
Victor Morgan discharged Home with wife per MD order.  Discharge instructions reviewed and discussed with the patient, all questions and concerns answered. Copy of instructions and scripts given to patient.    Medication List    TAKE these medications       acetaminophen 500 MG tablet  Commonly known as:  TYLENOL  Take 1,000 mg by mouth every 6 (six) hours as needed for pain.     ascorbic acid 1000 MG tablet  Commonly known as:  VITAMIN C  Take 1,000 mg by mouth daily.     aspirin 81 MG tablet  Take 81 mg by mouth 2 (two) times daily.     B-complex with vitamin C tablet  Take 1 tablet by mouth daily.     cetirizine 10 MG chewable tablet  Commonly known as:  ZYRTEC  Chew 10 mg by mouth daily.     clindamycin 300 MG capsule  Commonly known as:  CLEOCIN  Take 1 capsule (300 mg total) by mouth every 6 (six) hours.     COMBIVENT RESPIMAT 20-100 MCG/ACT Aers respimat  Generic drug:  Ipratropium-Albuterol  Inhale 1 puff into the lungs 4 (four) times daily as needed for wheezing or shortness of breath.     Fish Oil 1200 MG Cpdr  Take 1,200 mg by mouth daily.     fluticasone 50 MCG/ACT nasal spray  Commonly known as:  FLONASE  Place 2 sprays into the nose 2 (two) times daily.     hydrochlorothiazide 25 MG tablet  Commonly known as:  HYDRODIURIL  Take 25 mg by mouth daily.     metFORMIN 1000 MG tablet  Commonly known as:  GLUCOPHAGE  Take 1,000 mg by mouth 2 (two) times daily with a meal.     metoprolol 50 MG tablet  Commonly known as:  LOPRESSOR  Take 75 mg by mouth 2 (two) times daily.     multivitamin-lutein Caps  Take 1 capsule by mouth daily.     neomycin-bacitracin-polymyxin ophthalmic ointment  Commonly known as:  POLYSPORIN  Apply 1 application topically 2 (two) times daily. For facial cuts     nitroGLYCERIN 0.4 MG SL tablet  Commonly known as:  NITROSTAT  Place 0.4 mg under the tongue every 5 (five) minutes as needed.     omeprazole 20 MG capsule   Commonly known as:  PRILOSEC  Take 20 mg by mouth daily.     prednisoLONE acetate 1 % ophthalmic suspension  Commonly known as:  PRED FORTE  Place 1 drop into the left eye daily.     rosuvastatin 40 MG tablet  Commonly known as:  CRESTOR  Take 20 mg by mouth daily.     SINUS WASH NETI POT NA  Place 1 application into the nose 2 (two) times daily as needed (for washing sinuses).     sodium chloride 5 % ophthalmic solution  Commonly known as:  MURO 128  Place 1 drop into the left eye 3 (three) times daily.     triamcinolone cream 0.1 %  Commonly known as:  KENALOG  Apply 1 application topically 2 (two) times daily.     Vitamin D 2000 UNITS tablet  Take 2,000 Units by mouth daily.     vitamin E 1000 UNIT capsule  Take 1,000 Units by mouth daily.        Patients skin is clean, dry and intact, no evidence of skin break down.  Cellulitis to left lower leg, very red &  edematous. IV site discontinued and catheter remains intact. Site without signs and symptoms of complications. Dressing and pressure applied.  Patient escorted to car by volunteer in a wheelchair,  no distress noted upon discharge.  Victor Morgan, [place] C 11/17/2012 2:43 PM

## 2012-11-17 NOTE — Progress Notes (Signed)
Physical Therapy Treatment Patient Details Name: Victor Morgan MRN: [id number] DOB: 04/07/1933 Today's Date: 11/17/2012 Time: 4098-1191 PT Time Calculation (min): 36 min  PT Assessment / Plan / Recommendation Comments on Treatment Session  Pt is improved in mobilty and gait and is ready to d/c to home with no PT follow up    Follow Up Recommendations  No PT follow up;Supervision - Intermittent     Does the patient have the potential to tolerate intense rehabilitation     Barriers to Discharge        Equipment Recommendations  None recommended by PT    Recommendations for Other Services    Frequency Min 3X/week   Plan Discharge plan remains appropriate    Precautions / Restrictions Restrictions Weight Bearing Restrictions: No   Pertinent Vitals/Pain Continued, but improved, redness in left lower leg    Mobility  Bed Mobility Bed Mobility: Supine to Sit;Sitting - Scoot to Edge of Bed Supine to Sit: 6: Modified independent (Device/Increase time);7: Independent Sitting - Scoot to Edge of Bed: 6: Modified independent (Device/Increase time);7: Independent Transfers Transfers: Sit to Stand;Stand to Sit Sit to Stand: With upper extremity assist;From bed;7: Independent Stand to Sit: 5: Supervision;With upper extremity assist;To chair/3-in-1;With armrests;7: Independent Details for Transfer Assistance: moves slowly, but demos good use of UEs.   Able to stand better from elevated surface Ambulation/Gait Ambulation/Gait Assistance: 6: Modified independent (Device/Increase time) Ambulation Distance (Feet): 250 Feet Assistive device: None;Rolling walker Ambulation/Gait Assistance Details: intermittent cues to extend trunk and activate glutes for better hip extension Gait Pattern: Step-through pattern;Decreased stride length;Antalgic;Trunk flexed Gait velocity: decreased General Gait Details: Pt is mod I with a rolling walker and states he has one he can use at home if he needs it,  but he likely won't use a walker Stairs: Yes Stairs Assistance: 5: Supervision Stair Management Technique: One rail Left;Forwards Corporate treasurer: No    Exercises Other Exercises Other Exercises: repeated sit to stand x5 with emphasis on body extension in standing Pt does better from elevated surface Other Exercises: educated patient on elevation of leg to help contol edema Other Exercises: standing for 3 minutes with trunk activation and  movement of UEs to activate core   PT Diagnosis:    PT Problem List:   PT Treatment Interventions:     PT Goals Acute Rehab PT Goals PT Goal Formulation: With patient Time For Goal Achievement: 11/29/12 Potential to Achieve Goals: Good Pt will go Sit to Stand: with modified independence PT Goal: Sit to Stand - Progress: Met Pt will go Stand to Sit: with modified independence PT Goal: Stand to Sit - Progress: Met Pt will Ambulate: >150 feet;with modified independence PT Goal: Ambulate - Progress: Met Pt will Go Up / Down Stairs: Flight;with rail(s);with supervision PT Goal: Up/Down Stairs - Progress: Met  Visit Information  Last PT Received On: 11/17/12    Subjective Data  Subjective: to go home later today Patient Stated Goal: Home   Cognition  Cognition Arousal/Alertness: Awake/alert Behavior During Therapy: WFL for tasks assessed/performed Overall Cognitive Status: Within Functional Limits for tasks assessed    Balance  Balance Balance Assessed: Yes Static Standing Balance Static Standing - Balance Support: No upper extremity supported;During functional activity Static Standing - Level of Assistance: 5: Stand by assistance;7: Independent Static Standing - Comment/# of Minutes: Standing at sink to wash face and brush teeth  End of Session PT - End of Session Activity Tolerance: Patient tolerated treatment well Patient left: in  chair;with call bell/phone within reach;with family/visitor present Nurse  Communication: Mobility status   GP    Bayard Hugger. Manson Passey, [place] [phone] 11/17/2012, 10:39 AM

## 2012-11-17 NOTE — Discharge Summary (Signed)
PATIENT DETAILS Name: Victor Morgan Age: 79 y.o. Sex: male Date of Birth: [birth year] MRN: [id number]. Admit Date: 11/11/2012 Admitting Physician: Nelda Bucks, MD WUJ:WJXBJY,NWGN Sherilyn Cooter, MD  Recommendations for Outpatient Follow-up:  1. Please reassess left lower extremity on followup with primary care practitioner  PRIMARY DISCHARGE DIAGNOSIS:  Principal Problem:   Septic shock felt to be secondary to LE cellulitis Active Problems:   Hyperlipidemia   Sleep apnea, obstructive- on C Pap at home.   AKI (acute kidney injury on admission)- resolving   Hypokalemia   Hyperglycemia   CAD, ASMI in '97 Rx'd with LAD and staged CFX stenting. CABG X 17 May 2011   COPD, moderate   Type II or unspecified type diabetes mellitus with unspecified complication, uncontrolled   Elevated troponin- felt to be secondary to demand ischemia   Abnormal echocardiogram- ? new WMA since 10/31/12 echo      PAST MEDICAL HISTORY: Past Medical History  Diagnosis Date  . Cough     chronic cough d/t sinusitis  . Diabetes mellitus     type II  . Hyperlipidemia   . Sleep apnea, obstructive     CPAP NIGHTLY  . Myocardial infarction 1997  . Hx of heart artery stent 1997    PTA  AND STENT X2 TO LAD,STENT TO CICUMFLEX  . Obesity, mild   . Hypertension     DISCHARGE MEDICATIONS:   Medication List    TAKE these medications       acetaminophen 500 MG tablet  Commonly known as:  TYLENOL  Take 1,000 mg by mouth every 6 (six) hours as needed for pain.     ascorbic acid 1000 MG tablet  Commonly known as:  VITAMIN C  Take 1,000 mg by mouth daily.     aspirin 81 MG tablet  Take 81 mg by mouth 2 (two) times daily.     B-complex with vitamin C tablet  Take 1 tablet by mouth daily.     cetirizine 10 MG chewable tablet  Commonly known as:  ZYRTEC  Chew 10 mg by mouth daily.     clindamycin 300 MG capsule  Commonly known as:  CLEOCIN  Take 1 capsule (300 mg total) by mouth every 6 (six) hours.      COMBIVENT RESPIMAT 20-100 MCG/ACT Aers respimat  Generic drug:  Ipratropium-Albuterol  Inhale 1 puff into the lungs 4 (four) times daily as needed for wheezing or shortness of breath.     Fish Oil 1200 MG Cpdr  Take 1,200 mg by mouth daily.     fluticasone 50 MCG/ACT nasal spray  Commonly known as:  FLONASE  Place 2 sprays into the nose 2 (two) times daily.     hydrochlorothiazide 25 MG tablet  Commonly known as:  HYDRODIURIL  Take 25 mg by mouth daily.     metFORMIN 1000 MG tablet  Commonly known as:  GLUCOPHAGE  Take 1,000 mg by mouth 2 (two) times daily with a meal.     metoprolol 50 MG tablet  Commonly known as:  LOPRESSOR  Take 75 mg by mouth 2 (two) times daily.     multivitamin-lutein Caps  Take 1 capsule by mouth daily.     neomycin-bacitracin-polymyxin ophthalmic ointment  Commonly known as:  POLYSPORIN  Apply 1 application topically 2 (two) times daily. For facial cuts     nitroGLYCERIN 0.4 MG SL tablet  Commonly known as:  NITROSTAT  Place 0.4 mg under the tongue every 5 (five) minutes as  needed.     omeprazole 20 MG capsule  Commonly known as:  PRILOSEC  Take 20 mg by mouth daily.     prednisoLONE acetate 1 % ophthalmic suspension  Commonly known as:  PRED FORTE  Place 1 drop into the left eye daily.     rosuvastatin 40 MG tablet  Commonly known as:  CRESTOR  Take 20 mg by mouth daily.     SINUS WASH NETI POT NA  Place 1 application into the nose 2 (two) times daily as needed (for washing sinuses).     sodium chloride 5 % ophthalmic solution  Commonly known as:  MURO 128  Place 1 drop into the left eye 3 (three) times daily.     triamcinolone cream 0.1 %  Commonly known as:  KENALOG  Apply 1 application topically 2 (two) times daily.     Vitamin D 2000 UNITS tablet  Take 2,000 Units by mouth daily.     vitamin E 1000 UNIT capsule  Take 1,000 Units by mouth daily.        ALLERGIES:  No Known Allergies  BRIEF HPI:  See H&P, Labs,  Consult and Test reports for all details in brief, he should is a 79 year old male with a history of coronary artery disease, diabetes, and recent facial surgery who presented to the emergency room with altered mental status and hypotension.  CONSULTATIONS:   cardiology and pulmonary/intensive care  PERTINENT RADIOLOGIC STUDIES: Dg Chest 2 View  11/11/2012  *RADIOLOGY REPORT*  Clinical Data: Body aches, fever  CHEST - 2 VIEW  Comparison: 04/03/2012  Findings: Mild right hemidiaphragm elevation.  Mild bibasilar opacities.  Status post median sternotomy and CABG.  Heart size upper normal.  Mediastinal contours otherwise within normal range. Hypoaeration with interstitial and vascular crowding.  No pleural effusion or pneumothorax.  No acute osseous finding.  IMPRESSION: Mild bibasilar opacities, favored to reflect atelectasis.   Original Report Authenticated By: Jearld Lesch, M.D.    US Renal Port  11/12/2012  *RADIOLOGY REPORT*  Clinical Data: Acute renal insufficiency, diabetes, hypertension  RENAL/URINARY TRACT ULTRASOUND COMPLETE  Comparison:  None.  Findings:  Right Kidney:  Measures 14.5 cm.  Normal echogenicity and cortical thickness.  No hydronephrosis.  Right kidney lower pole demonstrates a 4.5 x 4.8 x 3.7 cm hypoechoic minimally septated cyst.  No other acute finding.  Left Kidney:  14.4 cm length.  Normal cortex and echogenicity. Left upper pole hypoechoic renal cyst measures 2.9 x 3.3 x 2.4 cm. Small exophytic mid pole hypoechoic cyst measures 13 mm.  No hydronephrosis or acute finding.  Bladder:  Not visualized.  Accessory splenule noted in the left upper quadrant.  No abdominal free fluid  IMPRESSION: Negative for hydronephrosis.  Bilateral renal cysts   Original Report Authenticated By: Judie Petit. Miles Costain, M.D.    Ct Maxillofacial W/cm  11/14/2012  *RADIOLOGY REPORT*  Clinical Data: Status post plastic surgery.  Fever.  Rule out infection.  CT MAXILLOFACIAL WITH CONTRAST  Technique:  Multidetector  CT imaging of the maxillofacial structures was performed with intravenous contrast. Multiplanar CT image reconstructions were also generated.  Contrast: 80mL OMNIPAQUE IOHEXOL 300 MG/ML  SOLN  Comparison: Limited CT of the sinuses 04/13/2012.  Findings: Hyperdense supraorbital infiltrates are evident bilaterally.  This is likely related to the patient's recent surgery and may represent injected material.  There are no areas of peripheral enhancement to suggest abscess.  The globes and orbits are intact.  The facial soft tissues are otherwise within  normal limits.  The paranasal sinuses and mastoid air cells are clear.  Limited imaging of the brain knee is unremarkable.  IMPRESSION:  1.  Bilateral supraorbital soft tissue densities are likely related to the patient's recent surgery and may represent injected material. 2.  No evidence for abscess.   Original Report Authenticated By: Marin Roberts, M.D.      PERTINENT LAB RESULTS: CBC:  Recent Labs  11/15/12 0609 11/16/12 0425  WBC 16.5* 11.6*  HGB 13.6 13.6  HCT 39.7 38.8*  PLT 172 171   CMET CMP     Component Value Date/Time   NA 140 11/16/2012 0425   K 3.6 11/16/2012 0425   CL 102 11/16/2012 0425   CO2 23 11/16/2012 0425   GLUCOSE 142* 11/16/2012 0425   BUN 16 11/16/2012 0425   CREATININE 1.02 11/16/2012 0425   CALCIUM 8.8 11/16/2012 0425   PROT 6.2 11/12/2012 1610   ALBUMIN 3.0* 11/12/2012 1610   AST 50* 11/12/2012 1610   ALT 31 11/12/2012 1610   ALKPHOS 54 11/12/2012 1610   BILITOT 0.7 11/12/2012 1610   GFRNONAA 69* 11/16/2012 0425   GFRAA 80* 11/16/2012 0425    GFR Estimated Creatinine Clearance: 74.6 ml/min (by C-G formula based on Cr of 1.02). No results found for this basename: LIPASE, AMYLASE,  in the last 72 hours No results found for this basename: CKTOTAL, CKMB, CKMBINDEX, TROPONINI,  in the last 72 hours No components found with this basename: POCBNP,  No results found for this basename: DDIMER,  in the last 72 hours No results found for  this basename: HGBA1C,  in the last 72 hours No results found for this basename: CHOL, HDL, LDLCALC, TRIG, CHOLHDL, LDLDIRECT,  in the last 72 hours No results found for this basename: TSH, T4TOTAL, FREET3, T3FREE, THYROIDAB,  in the last 72 hours No results found for this basename: VITAMINB12, FOLATE, FERRITIN, TIBC, IRON, RETICCTPCT,  in the last 72 hours Coags: No results found for this basename: PT, INR,  in the last 72 hours Microbiology: Recent Results (from the past 240 hour(s))  CULTURE, BLOOD (ROUTINE X 2)     Status: None   Collection Time    11/11/12 10:25 PM      Result Value Range Status   Specimen Description BLOOD RIGHT ARM   Final   Special Requests BOTTLES DRAWN AEROBIC AND ANAEROBIC 10CC EA   Final   Culture  Setup Time 11/12/2012 04:25   Final   Culture     Final   Value:        BLOOD CULTURE RECEIVED NO GROWTH TO DATE CULTURE WILL BE HELD FOR 5 DAYS BEFORE ISSUING A FINAL NEGATIVE REPORT   Report Status PENDING   Incomplete  CULTURE, BLOOD (ROUTINE X 2)     Status: None   Collection Time    11/11/12 10:35 PM      Result Value Range Status   Specimen Description BLOOD RIGHT HAND   Final   Special Requests BOTTLES DRAWN AEROBIC ONLY 10CC   Final   Culture  Setup Time 11/12/2012 04:25   Final   Culture     Final   Value:        BLOOD CULTURE RECEIVED NO GROWTH TO DATE CULTURE WILL BE HELD FOR 5 DAYS BEFORE ISSUING A FINAL NEGATIVE REPORT   Report Status PENDING   Incomplete  URINE CULTURE     Status: None   Collection Time    11/11/12 11:48 PM  Result Value Range Status   Specimen Description URINE, CATHETERIZED   Final   Special Requests NONE   Final   Culture  Setup Time 11/12/2012 12:50   Final   Colony Count NO GROWTH   Final   Culture NO GROWTH   Final   Report Status 11/13/2012 FINAL   Final  MRSA PCR SCREENING     Status: None   Collection Time    11/12/12  1:55 AM      Result Value Range Status   MRSA by PCR NEGATIVE  NEGATIVE Final   Comment:             The GeneXpert MRSA Assay (FDA     approved for NASAL specimens     only), is one component of a     comprehensive MRSA colonization     surveillance program. It is not     intended to diagnose MRSA     infection nor to guide or     monitor treatment for     MRSA infections.  URINE CULTURE     Status: None   Collection Time    11/12/12  5:24 AM      Result Value Range Status   Specimen Description URINE, CLEAN CATCH   Final   Special Requests NONE   Final   Culture  Setup Time 11/12/2012 12:50   Final   Colony Count NO GROWTH   Final   Culture NO GROWTH   Final   Report Status 11/13/2012 FINAL   Final     BRIEF HOSPITAL COURSE:   Principal Problem:   Septic shock felt to be secondary to LE cellulitis - Patient was initially admitted by the critical care team to the intensive care unit, was placed on aggressive fluid hydration and given empiric antibiotics in the form of vancomycin and Zosyn. Blood and urine cultures were obtained and these are negative so far. Patient was stabilized in the intensive care unit, upon obtaining hemodynamic stability, patient was transferred to a regular floor and was under the care of the triad hospitalists. He was continued on empiric IV vancomycin and Zosyn. During his admission stay, it was noted that the swelling and erythema in his left lower extremity tracking up to his upper medial thigh area. It was thought that this was the potential culprit and foci for his severe sepsis/septic shock. This area was marked, his erythema in that left upper medial thigh has completely resolved, ED erythema in the left lower leg is also slowly downtrending, he continues to have minimal swelling. He does not have pain or fever. A Doppler ultrasound of the left lower extremity was negative for DVT. Patient has been instructed to continue to keep his left lower extremity elevated at all times, he has received 6 days of empiric vancomycin and Zosyn, on discharge she  will be transitioned to clindamycin for another 4 more days- complete a 10 day course of antibiotics. Patient has been asked to followup with his primary care practitioner within one week.  Active Problems: Cellulitis LLE - as above. Cellulitis slowly continues to improve-he will be discharged on clindamycin. --B/L LLE dopplers negative for DVT   Acute renal failure  -Secondary to dehydration and sepsis. Patient presented with creatinine of 1.7 a.m. and reached a peak of 2.04  -Patient creatinine is improved to normal after aggressive hydration with IV fluids.   Leukocytosis  -Secondary to the sepsis syndrome. Slowly improving-almost back to normal   Elevated troponin  -  Marginally elevated troponins patient denies any chest pain.Do not think this is related to ACS-This is likely secondary to the sepsis/hypotension and the acute renal failure. However 2 D Echo done on 11/13/12-shows new regional wall motion abnormalities-no seen on prior echo on 10/31/12.  -Patient already on aspirin and beta blockers  -appreciate cards input-current plan is on outpatient stress testing  Diabetes mellitus type 2  --Controlled diabetes mellitus type 2 with A1c of 6.2.  -Metformin held on admission-will restart on discharge  HTN  -moderate control with Metoprolol - Resume HCTZ on discharge   TODAY-DAY OF DISCHARGE:  Subjective:   Victor Morgan today has no headache,no chest abdominal pain,no new weakness tingling or numbness, feels much better wants to go home today.   Objective:   Blood pressure 152/72, pulse 74, temperature 98.6 F (37 C), temperature source Oral, resp. rate 18, height 5\' 11"  (1.803 m), weight 104.5 kg (230 lb 6.1 oz), SpO2 95.00%.  Intake/Output Summary (Last 24 hours) at 11/17/12 1212 Last data filed at 11/17/12 1100  Gross per 24 hour  Intake 1622.67 ml  Output   2950 ml  Net -1327.33 ml   Filed Weights   11/14/12 0435 11/15/12 0426 11/17/12 0514  Weight: 104.4 kg (230  lb 2.6 oz) 104 kg (229 lb 4.5 oz) 104.5 kg (230 lb 6.1 oz)    Exam Awake Alert, Oriented *3, No new F.N deficits, Normal affect Western.AT,PERRAL Supple Neck,No JVD, No cervical lymphadenopathy appriciated.  Symmetrical Chest wall movement, Good air movement bilaterally, CTAB RRR,No Gallops,Rubs or new Murmurs, No Parasternal Heave +ve B.Sounds, Abd Soft, Non tender, No organomegaly appriciated, No rebound -guarding or rigidity. No Cyanosis, Clubbing or edema, No new Rash or bruise  DISCHARGE CONDITION: Stable  DISPOSITION: Home   DISCHARGE INSTRUCTIONS:    Activity:  As tolerated with Full fall precautions use walker/cane & assistance as needed  Diet recommendation: Diabetic Diet Heart Healthy diet      Discharge Orders   Future Appointments Provider Department Dept Phone   11/23/2012 1:30 PM [facility] [address] [phone]   Future Orders Complete By Expires     Call MD for:  redness, tenderness, or signs of infection (pain, swelling, redness, odor or green/yellow discharge around incision site)  As directed     Call MD for:  severe uncontrolled pain  As directed     Call MD for:  temperature >100.4  As directed     Diet - low sodium heart healthy  As directed     Diet Carb Modified  As directed     Increase activity slowly  As directed        Follow-up Information   Follow up with Governor Rooks, MD On 11/28/2012. (11:30 AM)    Contact information:   [address] [place] [address] [phone]       Follow up with Pamelia Hoit, MD. Schedule an appointment as soon as possible for a visit in 1 week.   Contact information:   [address] [place] [address] [phone]      Total Time spent on discharge equals 45 minutes.  SignedJeoffrey Massed 11/17/2012 12:12 PM

## 2012-11-18 LAB — CULTURE, BLOOD (ROUTINE X 2): Culture: NO GROWTH

## 2012-11-23 ENCOUNTER — Ambulatory Visit (HOSPITAL_COMMUNITY)
Admit: 2012-11-23 | Discharge: 2012-11-23 | Disposition: A | Discharge status: Home / Self Care | Payer: Medicare Other | Source: Ambulatory Visit | Attending: Cardiovascular Disease | Admitting: Cardiovascular Disease

## 2012-11-23 DIAGNOSIS — I248 Other forms of acute ischemic heart disease: Secondary | ICD-10-CM

## 2012-11-23 DIAGNOSIS — J449 Chronic obstructive pulmonary disease, unspecified: Secondary | ICD-10-CM | POA: Insufficient documentation

## 2012-11-23 DIAGNOSIS — I2489 Other forms of acute ischemic heart disease: Secondary | ICD-10-CM

## 2012-11-23 DIAGNOSIS — I2510 Atherosclerotic heart disease of native coronary artery without angina pectoris: Secondary | ICD-10-CM

## 2012-11-23 DIAGNOSIS — E669 Obesity, unspecified: Secondary | ICD-10-CM | POA: Insufficient documentation

## 2012-11-23 DIAGNOSIS — E119 Type 2 diabetes mellitus without complications: Secondary | ICD-10-CM | POA: Insufficient documentation

## 2012-11-23 DIAGNOSIS — I10 Essential (primary) hypertension: Secondary | ICD-10-CM | POA: Insufficient documentation

## 2012-11-23 DIAGNOSIS — R9431 Abnormal electrocardiogram [ECG] [EKG]: Secondary | ICD-10-CM | POA: Insufficient documentation

## 2012-11-23 DIAGNOSIS — Z951 Presence of aortocoronary bypass graft: Secondary | ICD-10-CM | POA: Insufficient documentation

## 2012-11-23 DIAGNOSIS — R7989 Other specified abnormal findings of blood chemistry: Secondary | ICD-10-CM

## 2012-11-23 DIAGNOSIS — R5381 Other malaise: Secondary | ICD-10-CM | POA: Insufficient documentation

## 2012-11-23 DIAGNOSIS — I252 Old myocardial infarction: Secondary | ICD-10-CM | POA: Insufficient documentation

## 2012-11-23 DIAGNOSIS — J4489 Other specified chronic obstructive pulmonary disease: Secondary | ICD-10-CM | POA: Insufficient documentation

## 2012-11-23 MED ORDER — TECHNETIUM TC 99M SESTAMIBI GENERIC - CARDIOLITE
10.4000 | Freq: Once | INTRAVENOUS | Status: AC | PRN
  Administered 2012-11-23: 10 via INTRAVENOUS

## 2012-11-23 MED ORDER — TECHNETIUM TC 99M SESTAMIBI GENERIC - CARDIOLITE
29.9000 | Freq: Once | INTRAVENOUS | Status: AC | PRN
  Administered 2012-11-23: 29.9 via INTRAVENOUS

## 2012-11-23 MED ORDER — REGADENOSON 0.4 MG/5ML IV SOLN
0.4000 mg | Freq: Once | INTRAVENOUS | Status: AC
  Administered 2012-11-23: 0.4 mg via INTRAVENOUS

## 2012-11-23 NOTE — Procedures (Addendum)
[facility] [address] [place] [address] [place] [address] [phone]  Cardiology Nuclear Med Study  Victor Morgan is a 79 y.o. male     MRN : [id number]     DOB: December 23, 1932  Procedure Date: 11/23/2012  Nuclear Med Background Indication for Stress Test:  Graft Patency and Abnormal EKG History:  COPD and CAD;MI1997;CABG X5--05/2011;STENT/PTCA--07/1995 AND 03/1996 Cardiac Risk Factors: Family History - CAD, History of Smoking, Hypertension, Lipids, NIDDM and Obesity  Symptoms:  Fatigue   Nuclear Pre-Procedure Caffeine/Decaff Intake:  1:00am NPO After: 11 AM   IV Site: R Antecubital  IV 0.9% NS with Angio Cath:  22g  Chest Size (in):  46"  IV Started by: Emmit Pomfret, RN  Height: 5\' 10"  (1.778 m)  Cup Size: n/a  BMI:  Body mass index is 32.28 kg/(m^2). Weight:  225 lb (102.059 kg)   Tech Comments:  N/A    Nuclear Med Study 1 or 2 day study: 1 day  Stress Test Type:  Lexiscan  Order Authorizing Provider:  Hazeline Junker   Resting Radionuclide: Technetium 34m Sestamibi  Resting Radionuclide Dose: 10.4 mCi   Stress Radionuclide:  Technetium 96m Sestamibi  Stress Radionuclide Dose: 29.9 mCi           Stress Protocol Rest HR: 64 Stress HR: 80  Rest BP: 144/66 Stress BP:146/60  Exercise Time (min): n/a METS: n/a          Dose of Adenosine (mg):  n/a Dose of Lexiscan: 0.4 mg  Dose of Atropine (mg): n/a Dose of Dobutamine: n/a mcg/kg/min (at max HR)  Stress Test Technologist: Ernestene Mention, CCT Nuclear Technologist: Gonzella Lex, CNMT   Rest Procedure:  Myocardial perfusion imaging was performed at rest 45 minutes following the intravenous administration of Technetium 28m Sestamibi. Stress Procedure:  The patient received IV Lexiscan 0.4 mg over 15-seconds.  Technetium 75m Sestamibi injected at 30-seconds.  There were no significant changes with Lexiscan.  Quantitative spect images were obtained after a 45 minute  delay.  Transient Ischemic Dilatation (Normal <1.22):  1.0 Lung/Heart Ratio (Normal <0.45):  0.33 QGS EDV:  97 ml QGS ESV:  56 ml LV Ejection Fraction: 43%     Rest ECG: NSR. Old anterior wall infarction.  Stress ECG: No significant change from baseline ECG  QPS Raw Data Images:  Normal; no motion artifact; normal heart/lung ratio. Stress Images:  there is severely reduced uptake in a large segment of the anterior wall. anterior septum and the entire apex Rest Images:  Comparison with the stress images reveals no significant change. Subtraction (SDS):  There is a fixed defect that is most consistent with a previous infarction. No reversible defects are seen.  Impression Exercise Capacity:  Lexiscan with no exercise. BP Response:  Normal blood pressure response. Clinical Symptoms:  No significant symptoms noted. ECG Impression:  No significant ECG changes with Lexiscan. Comparison with Prior Nuclear Study: No significant change from previous study  Overall Impression:  Low risk stress nuclear study : there ia an extensive scar in the LAD artery distribution, without ischemia..  LV Wall Motion:  Mildly depressed overall systolic function. Anteroapical dyskinesis.   Thurmon Fair, MD  11/23/2012 7:11 PM

## 2012-11-29 NOTE — Progress Notes (Signed)
Pt. Seen on 11-29-2012 in office by dr. Alanda Amass

## 2012-11-30 ENCOUNTER — Other Ambulatory Visit (HOSPITAL_COMMUNITY): Payer: Self-pay | Admitting: Cardiovascular Disease

## 2012-11-30 DIAGNOSIS — L0390 Cellulitis, unspecified: Secondary | ICD-10-CM

## 2012-11-30 DIAGNOSIS — R0989 Other specified symptoms and signs involving the circulatory and respiratory systems: Secondary | ICD-10-CM

## 2012-11-30 DIAGNOSIS — I25810 Atherosclerosis of coronary artery bypass graft(s) without angina pectoris: Secondary | ICD-10-CM

## 2012-12-01 ENCOUNTER — Encounter: Payer: Self-pay | Admitting: Cardiovascular Disease

## 2012-12-19 ENCOUNTER — Encounter (HOSPITAL_COMMUNITY): Payer: Medicare Other

## 2012-12-28 ENCOUNTER — Ambulatory Visit (HOSPITAL_COMMUNITY)
Admission: RE | Admit: 2012-12-28 | Discharge: 2012-12-28 | Disposition: A | Discharge status: Home / Self Care | Payer: Medicare Other | Source: Ambulatory Visit | Attending: Cardiovascular Disease | Admitting: Cardiovascular Disease

## 2012-12-28 DIAGNOSIS — L0390 Cellulitis, unspecified: Secondary | ICD-10-CM

## 2012-12-28 DIAGNOSIS — I25810 Atherosclerosis of coronary artery bypass graft(s) without angina pectoris: Secondary | ICD-10-CM

## 2012-12-28 DIAGNOSIS — R0989 Other specified symptoms and signs involving the circulatory and respiratory systems: Secondary | ICD-10-CM | POA: Insufficient documentation

## 2012-12-28 NOTE — Progress Notes (Signed)
Lower Extremity Arterial Duplex Completed. °Victor Morgan ° °

## 2012-12-28 NOTE — Progress Notes (Signed)
Carotid Duplex Completed. °Victor Morgan ° °

## 2013-01-16 ENCOUNTER — Other Ambulatory Visit: Payer: Self-pay | Admitting: Cardiovascular Disease

## 2013-01-16 LAB — COMPREHENSIVE METABOLIC PANEL
ALT: 16 U/L (ref 0–53)
AST: 20 U/L (ref 0–37)
Albumin: 4.6 g/dL (ref 3.5–5.2)
Alkaline Phosphatase: 58 U/L (ref 39–117)
Calcium: 9.5 mg/dL (ref 8.4–10.5)
Chloride: 106 mEq/L (ref 96–112)
Creat: 1 mg/dL (ref 0.50–1.35)
Potassium: 4.2 mEq/L (ref 3.5–5.3)

## 2013-01-16 LAB — LIPID PANEL
LDL Cholesterol: 53 mg/dL (ref 0–99)
Total CHOL/HDL Ratio: 3.9 Ratio

## 2013-01-16 LAB — CBC
Platelets: 233 10*3/uL (ref 150–400)
RDW: 15.1 % (ref 11.5–15.5)
WBC: 7.4 10*3/uL (ref 4.0–10.5)

## 2013-04-25 IMAGING — CT CT PARANASAL SINUSES LIMITED
1 series · 12 of 14 positions shown, 15 images · non-contrast
Comparison: None.

CLINICAL DATA: Recurrent sinus congestion.

CT LIMITED SINUSES WITHOUT CONTRAST
TECHNIQUE: Multidetector CT images of the paranasal sinuses were
obtained in a single plane without contrast.

[Series 3: sinus prone 5.0 h31s · axial · 0.30mm/px · z∈[+1189,+1305]mm · 12 of 14 slices shown, 15 images]
[im 2/14  brain]
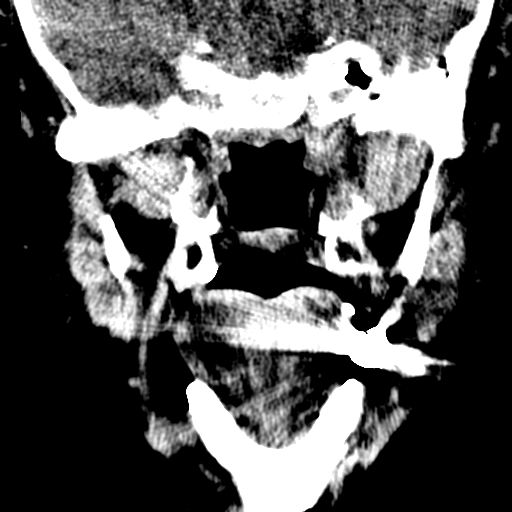
[im 2/14  bone]
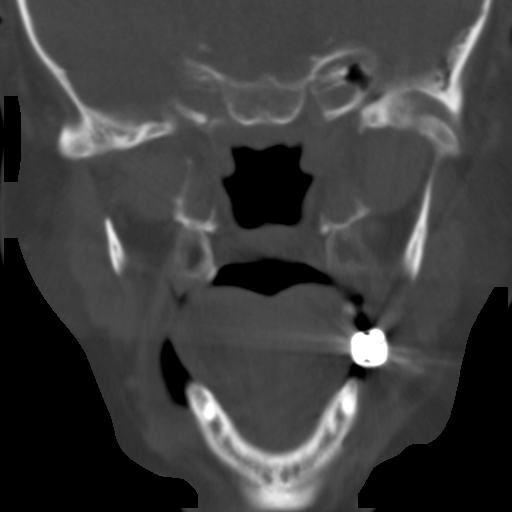
[im 3/14  bone]
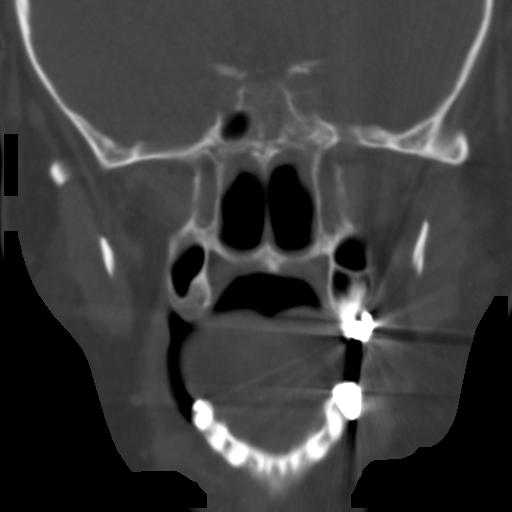
[im 4/14  bone]
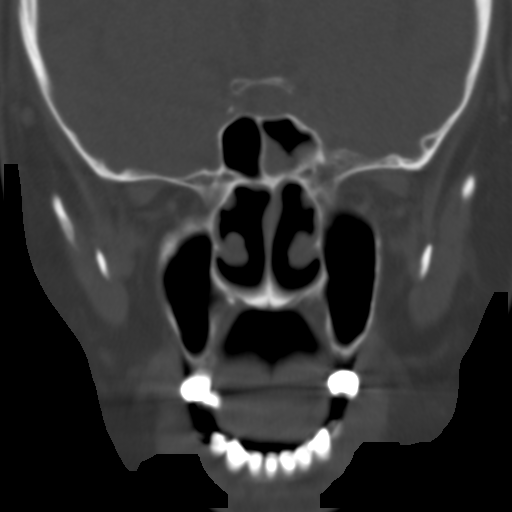
[im 5/14  bone]
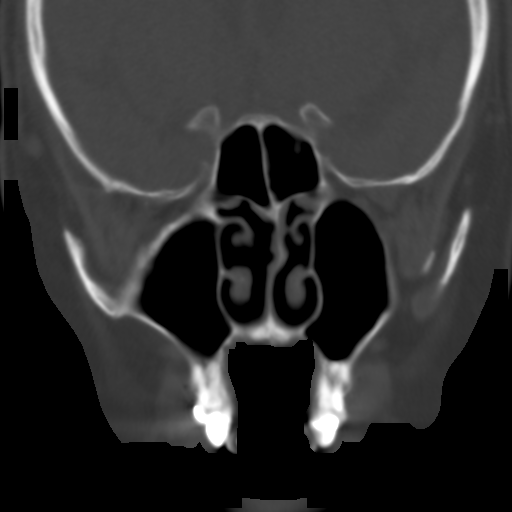
[im 6/14  brain]
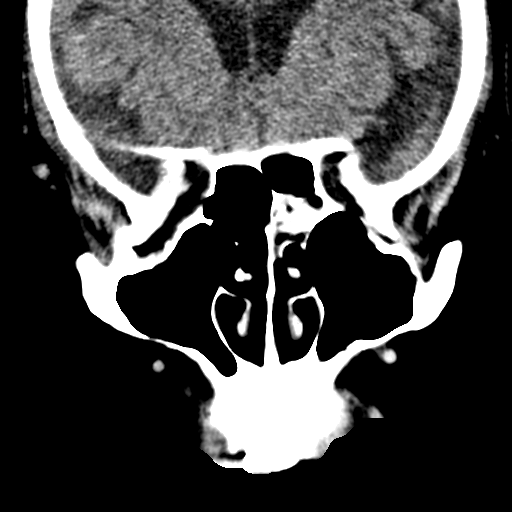
[im 6/14  bone]
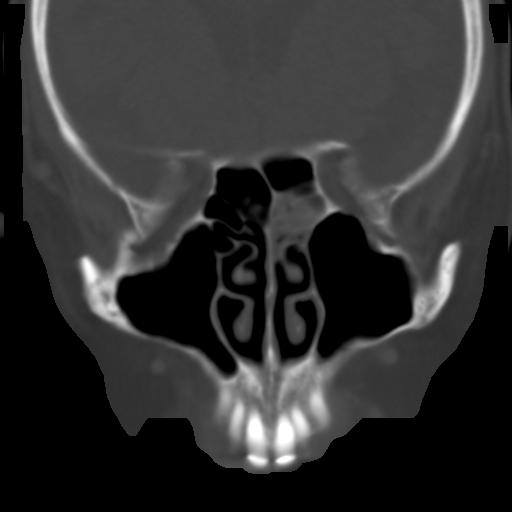
[im 7/14  bone]
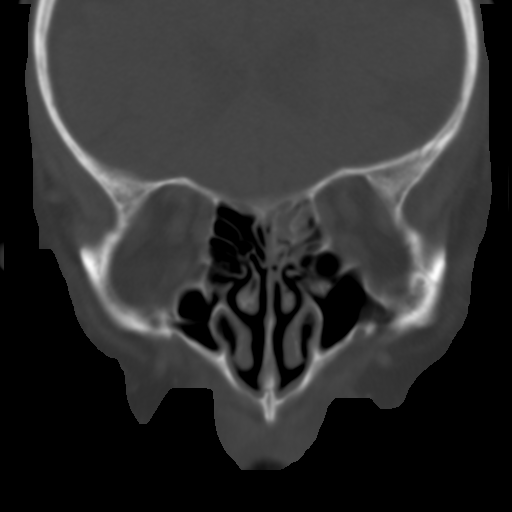
[im 8/14  bone]
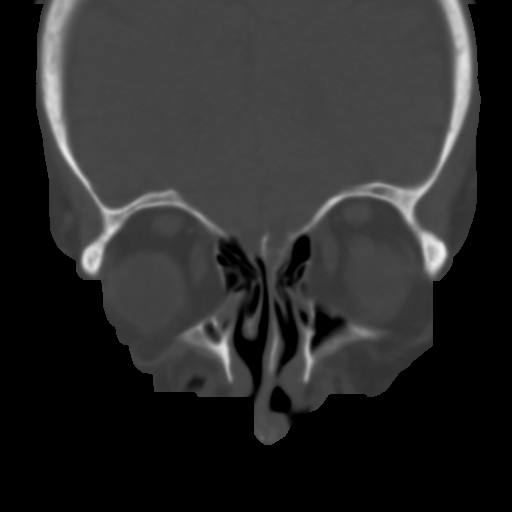
[im 9/14  bone]
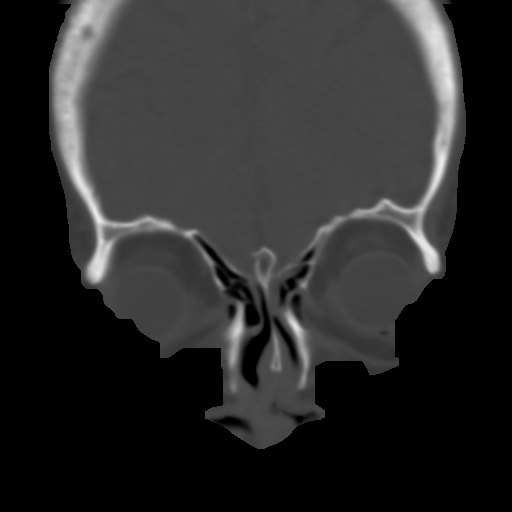
[im 10/14  brain]
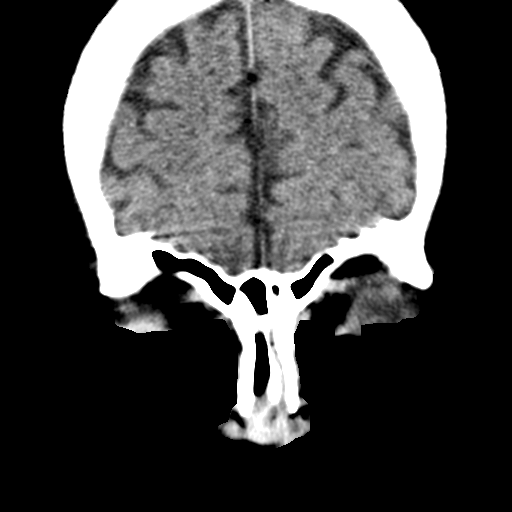
[im 10/14  bone]
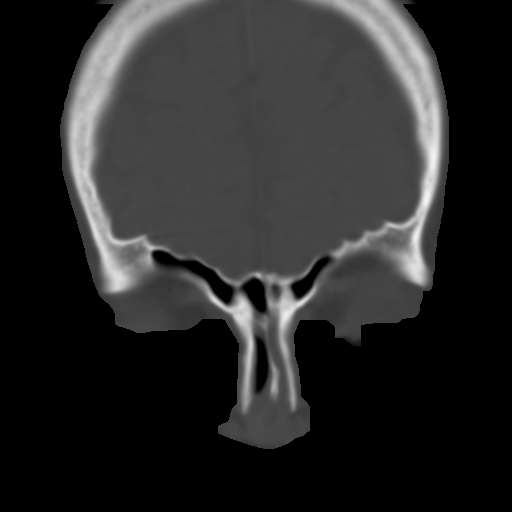
[im 11/14  bone]
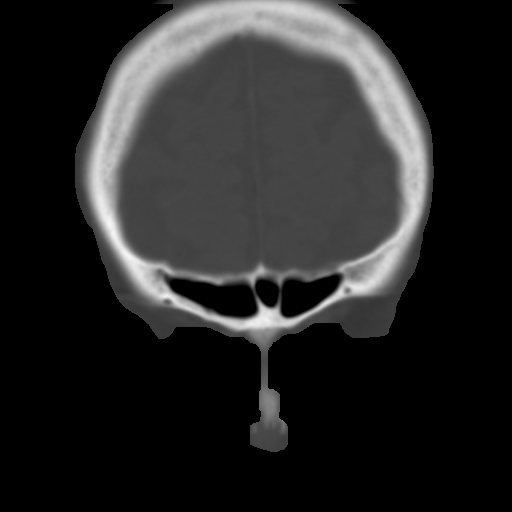
[im 12/14  bone]
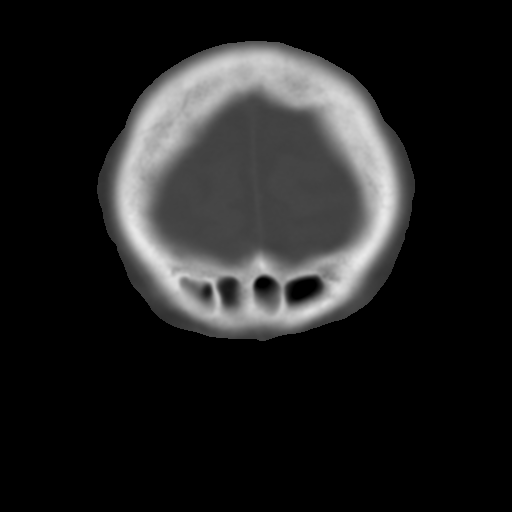
[im 13/14  bone]
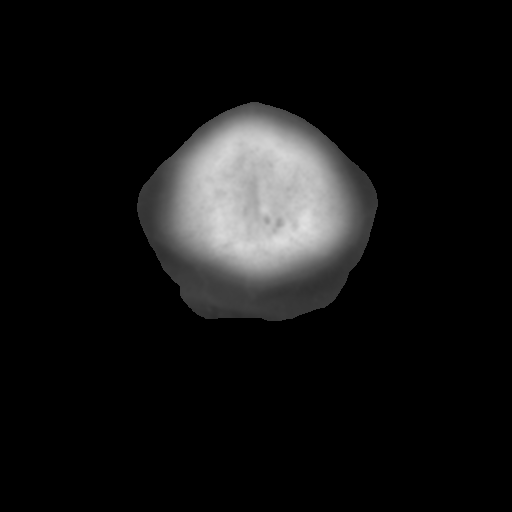

[12 of 14 positions shown; findings below may reference images not displayed]

FINDINGS: There is opacification of the posterior ethmoid air
cells on the left.  Moderate mucosal thickening is present in the
left sphenoid sinus as well. The paranasal sinuses are otherwise
clear.  The nasal septum is grossly midline.

Limited imaging of the brain demonstrates mild generalized atrophy.
IMPRESSION: 1.  Posterior left ethmoid and sphenoid sinus disease as described.
2.  Mild generalized cerebral atrophy.

## 2013-05-17 ENCOUNTER — Other Ambulatory Visit: Payer: Self-pay

## 2013-06-18 ENCOUNTER — Telehealth (HOSPITAL_COMMUNITY): Payer: Self-pay | Admitting: *Deleted

## 2013-07-16 ENCOUNTER — Encounter (HOSPITAL_COMMUNITY): Payer: Medicare Other

## 2013-09-01 ENCOUNTER — Encounter (HOSPITAL_COMMUNITY): Payer: Self-pay | Admitting: Emergency Medicine

## 2013-09-01 ENCOUNTER — Emergency Department (HOSPITAL_COMMUNITY): Payer: Medicare HMO

## 2013-09-01 ENCOUNTER — Inpatient Hospital Stay (HOSPITAL_COMMUNITY)
Admission: EM | Admit: 2013-09-01 | Discharge: 2013-09-07 | DRG: 872 | Disposition: A | Discharge status: Home / Self Care | Payer: Medicare HMO | Attending: Internal Medicine | Admitting: Internal Medicine

## 2013-09-01 DIAGNOSIS — J449 Chronic obstructive pulmonary disease, unspecified: Secondary | ICD-10-CM

## 2013-09-01 DIAGNOSIS — R778 Other specified abnormalities of plasma proteins: Secondary | ICD-10-CM

## 2013-09-01 DIAGNOSIS — N179 Acute kidney failure, unspecified: Secondary | ICD-10-CM

## 2013-09-01 DIAGNOSIS — Z7982 Long term (current) use of aspirin: Secondary | ICD-10-CM

## 2013-09-01 DIAGNOSIS — R6521 Severe sepsis with septic shock: Secondary | ICD-10-CM

## 2013-09-01 DIAGNOSIS — R739 Hyperglycemia, unspecified: Secondary | ICD-10-CM

## 2013-09-01 DIAGNOSIS — I5032 Chronic diastolic (congestive) heart failure: Secondary | ICD-10-CM

## 2013-09-01 DIAGNOSIS — Z79899 Other long term (current) drug therapy: Secondary | ICD-10-CM

## 2013-09-01 DIAGNOSIS — Z6831 Body mass index (BMI) 31.0-31.9, adult: Secondary | ICD-10-CM

## 2013-09-01 DIAGNOSIS — L0291 Cutaneous abscess, unspecified: Secondary | ICD-10-CM

## 2013-09-01 DIAGNOSIS — A401 Sepsis due to streptococcus, group B: Secondary | ICD-10-CM

## 2013-09-01 DIAGNOSIS — I252 Old myocardial infarction: Secondary | ICD-10-CM

## 2013-09-01 DIAGNOSIS — IMO0002 Reserved for concepts with insufficient information to code with codable children: Secondary | ICD-10-CM

## 2013-09-01 DIAGNOSIS — L0390 Cellulitis, unspecified: Secondary | ICD-10-CM

## 2013-09-01 DIAGNOSIS — E118 Type 2 diabetes mellitus with unspecified complications: Secondary | ICD-10-CM

## 2013-09-01 DIAGNOSIS — I10 Essential (primary) hypertension: Secondary | ICD-10-CM

## 2013-09-01 DIAGNOSIS — E876 Hypokalemia: Secondary | ICD-10-CM

## 2013-09-01 DIAGNOSIS — J329 Chronic sinusitis, unspecified: Secondary | ICD-10-CM

## 2013-09-01 DIAGNOSIS — A419 Sepsis, unspecified organism: Secondary | ICD-10-CM

## 2013-09-01 DIAGNOSIS — A409 Streptococcal sepsis, unspecified: Principal | ICD-10-CM | POA: Diagnosis present

## 2013-09-01 DIAGNOSIS — G4733 Obstructive sleep apnea (adult) (pediatric): Secondary | ICD-10-CM

## 2013-09-01 DIAGNOSIS — Z87891 Personal history of nicotine dependence: Secondary | ICD-10-CM

## 2013-09-01 DIAGNOSIS — L03119 Cellulitis of unspecified part of limb: Secondary | ICD-10-CM

## 2013-09-01 DIAGNOSIS — I219 Acute myocardial infarction, unspecified: Secondary | ICD-10-CM

## 2013-09-01 DIAGNOSIS — M109 Gout, unspecified: Secondary | ICD-10-CM

## 2013-09-01 DIAGNOSIS — E669 Obesity, unspecified: Secondary | ICD-10-CM

## 2013-09-01 DIAGNOSIS — I25810 Atherosclerosis of coronary artery bypass graft(s) without angina pectoris: Secondary | ICD-10-CM

## 2013-09-01 DIAGNOSIS — Z951 Presence of aortocoronary bypass graft: Secondary | ICD-10-CM

## 2013-09-01 DIAGNOSIS — T8140XA Infection following a procedure, unspecified, initial encounter: Secondary | ICD-10-CM | POA: Diagnosis present

## 2013-09-01 DIAGNOSIS — R7989 Other specified abnormal findings of blood chemistry: Secondary | ICD-10-CM

## 2013-09-01 DIAGNOSIS — Z9861 Coronary angioplasty status: Secondary | ICD-10-CM

## 2013-09-01 DIAGNOSIS — R000 Tachycardia, unspecified: Secondary | ICD-10-CM | POA: Diagnosis present

## 2013-09-01 DIAGNOSIS — E785 Hyperlipidemia, unspecified: Secondary | ICD-10-CM

## 2013-09-01 DIAGNOSIS — L02619 Cutaneous abscess of unspecified foot: Secondary | ICD-10-CM | POA: Diagnosis present

## 2013-09-01 DIAGNOSIS — R931 Abnormal findings on diagnostic imaging of heart and coronary circulation: Secondary | ICD-10-CM

## 2013-09-01 DIAGNOSIS — Z800 Family history of malignant neoplasm of digestive organs: Secondary | ICD-10-CM

## 2013-09-01 DIAGNOSIS — E1165 Type 2 diabetes mellitus with hyperglycemia: Secondary | ICD-10-CM | POA: Diagnosis present

## 2013-09-01 DIAGNOSIS — I509 Heart failure, unspecified: Secondary | ICD-10-CM | POA: Diagnosis present

## 2013-09-01 DIAGNOSIS — J4489 Other specified chronic obstructive pulmonary disease: Secondary | ICD-10-CM | POA: Diagnosis present

## 2013-09-01 DIAGNOSIS — I2510 Atherosclerotic heart disease of native coronary artery without angina pectoris: Secondary | ICD-10-CM | POA: Diagnosis present

## 2013-09-01 LAB — URINALYSIS, ROUTINE W REFLEX MICROSCOPIC
Bilirubin Urine: NEGATIVE
Glucose, UA: NEGATIVE mg/dL
Hgb urine dipstick: NEGATIVE
KETONES UR: NEGATIVE mg/dL
NITRITE: NEGATIVE
PROTEIN: NEGATIVE mg/dL
Specific Gravity, Urine: 1.024 (ref 1.005–1.030)
UROBILINOGEN UA: 0.2 mg/dL (ref 0.0–1.0)
pH: 5 (ref 5.0–8.0)

## 2013-09-01 LAB — BASIC METABOLIC PANEL
BUN: 20 mg/dL (ref 6–23)
CALCIUM: 9.5 mg/dL (ref 8.4–10.5)
CO2: 26 meq/L (ref 19–32)
Chloride: 95 mEq/L — ABNORMAL LOW (ref 96–112)
Creatinine, Ser: 1.01 mg/dL (ref 0.50–1.35)
GFR calc Af Amer: 80 mL/min — ABNORMAL LOW (ref >90)
GFR calc non Af Amer: 69 mL/min — ABNORMAL LOW (ref >90)
GLUCOSE: 159 mg/dL — AB (ref 70–99)
POTASSIUM: 3.7 meq/L (ref 3.7–5.3)
SODIUM: 136 meq/L — AB (ref 137–147)

## 2013-09-01 LAB — CBC
HEMATOCRIT: 43.9 % (ref 39.0–52.0)
HEMOGLOBIN: 15 g/dL (ref 13.0–17.0)
MCH: 31.3 pg (ref 26.0–34.0)
MCHC: 34.2 g/dL (ref 30.0–36.0)
MCV: 91.6 fL (ref 78.0–100.0)
PLATELETS: 149 10*3/uL — AB (ref 150–400)
RBC: 4.79 MIL/uL (ref 4.22–5.81)
RDW: 14.2 % (ref 11.5–15.5)
WBC: 16.8 10*3/uL — ABNORMAL HIGH (ref 4.0–10.5)

## 2013-09-01 LAB — URINE MICROSCOPIC-ADD ON

## 2013-09-01 LAB — I-STAT CG4 LACTIC ACID, ED
LACTIC ACID, VENOUS: 2.75 mmol/L — AB (ref 0.5–2.2)
Lactic Acid, Venous: 3.71 mmol/L — ABNORMAL HIGH (ref 0.5–2.2)

## 2013-09-01 LAB — CBG MONITORING, ED: Glucose-Capillary: 187 mg/dL — ABNORMAL HIGH (ref 70–99)

## 2013-09-01 MED ORDER — SODIUM CHLORIDE 0.9 % IV SOLN
1000.0000 mL | Freq: Once | INTRAVENOUS | Status: AC
  Administered 2013-09-01: 1000 mL via INTRAVENOUS

## 2013-09-01 MED ORDER — SODIUM CHLORIDE 0.9 % IV SOLN: 1000.0000 mL | INTRAVENOUS | Status: DC

## 2013-09-01 MED ORDER — PIPERACILLIN-TAZOBACTAM 3.375 G IVPB 30 MIN
3.3750 g | Freq: Once | INTRAVENOUS | Status: AC
  Administered 2013-09-01: 3.375 g via INTRAVENOUS
  Filled 2013-09-01: qty 50

## 2013-09-01 MED ORDER — PIPERACILLIN-TAZOBACTAM 3.375 G IVPB
3.3750 g | Freq: Three times a day (TID) | INTRAVENOUS | Status: DC
  Administered 2013-09-02 – 2013-09-04 (×7): 3.375 g via INTRAVENOUS
  Filled 2013-09-01 (×8): qty 50

## 2013-09-01 MED ORDER — SODIUM CHLORIDE 0.9 % IV BOLUS (SEPSIS)
1000.0000 mL | Freq: Once | INTRAVENOUS | Status: AC
  Administered 2013-09-01: 1000 mL via INTRAVENOUS

## 2013-09-01 MED ORDER — HYDROMORPHONE HCL PF 1 MG/ML IJ SOLN
1.0000 mg | Freq: Once | INTRAMUSCULAR | Status: AC
  Administered 2013-09-01: 1 mg via INTRAVENOUS
  Filled 2013-09-01: qty 1

## 2013-09-01 MED ORDER — SODIUM CHLORIDE 0.9 % IV SOLN
1250.0000 mg | Freq: Two times a day (BID) | INTRAVENOUS | Status: DC
  Administered 2013-09-02 – 2013-09-03 (×4): 1250 mg via INTRAVENOUS
  Filled 2013-09-01 (×5): qty 1250

## 2013-09-01 MED ORDER — ACETAMINOPHEN 500 MG PO TABS: 1000.0000 mg | ORAL_TABLET | ORAL | Status: DC

## 2013-09-01 MED ORDER — VANCOMYCIN HCL IN DEXTROSE 1-5 GM/200ML-% IV SOLN
1000.0000 mg | Freq: Once | INTRAVENOUS | Status: AC
  Administered 2013-09-01: 1000 mg via INTRAVENOUS
  Filled 2013-09-01: qty 200

## 2013-09-01 NOTE — ED Notes (Signed)
Dr Fonnie Jarvis given a copy of lactic acid results 3.71

## 2013-09-01 NOTE — ED Notes (Signed)
No signs of fluid overload after first liter of fluid in.  Pt sts he is not short of breath.  VSS.  Pt still continues to be ST on the monitor.

## 2013-09-01 NOTE — H&P (Signed)
[facility] Admission Note Date: 09/01/2013  Patient name: Victor ReichertWilliam E Finnie Medical record number: [id number] Date of birth: 07/03/1933 Age: 80 y.o. Gender: male PCP: Pamelia HoitWILSON,FRED HENRY, MD  History of Present Illness: 80 yo man with multiple chronic medical problems including CAD, sleep apnea, DM, and COPD presented with complaints of fever, chills and weakness. He has a history of sepsis from lower extremity cellulitis and this was similar to prior presentation. He has surgery on his foot 1 week ago-removal of an ingrown toenail. On initial ED evaluation no signs of LE cellulitis however 1 hour later left lower extremity demonstrated streaky redness and swelling, patient also spiked a temp to 103.9 and had flushing of his skin. Lactic acid levels rising. BP normal, WBC elevated. Admission requested for management of cellulitis with possible sepsis in setting of rising lactic acid levels.  Meds: Current Facility-Administered Medications  Medication Dose Route Frequency Provider Last Rate Last Dose  . 0.9 %  sodium chloride infusion  1,000 mL Intravenous Continuous Hurman HornJohn M Bednar, MD      . Melene Muller[START ON 09/02/2013] acetaminophen (TYLENOL) tablet 1,000 mg  1,000 mg Oral STAT Hurman HornJohn M Bednar, MD      . Melene Muller[START ON 09/02/2013] piperacillin-tazobactam (ZOSYN) IVPB 3.375 g  3.375 g Intravenous 3 times per day Abran DukeJames Ledford, [facility]      . [START ON 09/02/2013] vancomycin (VANCOCIN) 1,250 mg in sodium chloride 0.9 % 250 mL IVPB  1,250 mg Intravenous Q12H Abran DukeJames Ledford, [facility]       Current Outpatient Prescriptions  Medication Sig Dispense Refill  . acetaminophen (TYLENOL) 500 MG tablet Take 1,000 mg by mouth every 6 (six) hours as needed for pain.      Marland Kitchen. allopurinol (ZYLOPRIM) 300 MG tablet Take 300 mg by mouth daily.      . Artificial Tear Ointment (LUBRICANT EYE) OINT Place 1 application into the left eye at bedtime as needed (dry eye).      Marland Kitchen. ascorbic acid (VITAMIN C) 1000 MG tablet Take 1,000 mg by  mouth daily.      Marland Kitchen. aspirin 81 MG tablet Take 81 mg by mouth 2 (two) times daily.      . B Complex-C (B-COMPLEX WITH VITAMIN C) tablet Take 1 tablet by mouth daily.      . cetirizine (ZYRTEC) 10 MG chewable tablet Chew 10 mg by mouth at bedtime.       . Fish Oil-Cholecalciferol (FISH OIL + D3) 1200-1000 MG-UNIT CAPS Take 1 capsule by mouth every morning.      . fluticasone (FLONASE) 50 MCG/ACT nasal spray Place 2 sprays into the nose 2 (two) times daily.      . hydrochlorothiazide (HYDRODIURIL) 25 MG tablet Take 25 mg by mouth daily.        . hydroxypropyl methylcellulose (ISOPTO TEARS) 2.5 % ophthalmic solution Place 1 drop into both eyes daily as needed for dry eyes.      . Ipratropium-Albuterol (COMBIVENT RESPIMAT) 20-100 MCG/ACT AERS respimat Inhale 1 puff into the lungs 4 (four) times daily as needed for wheezing or shortness of breath.       . metFORMIN (GLUCOPHAGE) 1000 MG tablet Take 1,000 mg by mouth 2 (two) times daily with a meal.        . metoprolol (LOPRESSOR) 50 MG tablet Take 75 mg by mouth 2 (two) times daily.      . nitroGLYCERIN (NITROSTAT) 0.4 MG SL tablet Place 0.4 mg under the tongue every 5 (five) minutes as needed.      .Marland Kitchen  omeprazole (PRILOSEC) 20 MG capsule Take 20 mg by mouth daily.        . prednisoLONE acetate (PRED FORTE) 1 % ophthalmic suspension Place 1 drop into the left eye daily.      . rosuvastatin (CRESTOR) 40 MG tablet Take 20 mg by mouth daily.      . Sodium Chloride-Sodium Bicarb (SINUS WASH NETI POT NA) Place 1 application into the nose 2 (two) times daily as needed (for washing sinuses).       . triamcinolone cream (KENALOG) 0.1 % Apply 1 application topically 2 (two) times daily.       . vitamin E 1000 UNIT capsule Take 1,000 Units by mouth daily.        Allergies: Allergies as of 09/01/2013  . (No Known Allergies)   Past Medical History  Diagnosis Date  . Cough     chronic cough d/t sinusitis  . Diabetes mellitus     type II  . Hyperlipidemia    . Sleep apnea, obstructive     CPAP NIGHTLY  . Myocardial infarction 1997  . Hx of heart artery stent 1997    PTA  AND STENT X2 TO LAD,STENT TO CICUMFLEX  . Obesity, mild   . Hypertension    Past Surgical History  Procedure Laterality Date  . Heart bypass  nov 2012  . Cornea graft      bilateral eyes  . Cataract extraction      bilateral eyes   Family History  Problem Relation Age of Onset  . Colon cancer Mother    History   Social History  . Marital Status: Married    Spouse Name: N/A    Number of Children: N/A  . Years of Education: N/A   Occupational History  . 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 Smoking status: Former Smoker -- 1.00 packs/day for 45 years    Types: Cigarettes    Quit date: 05/19/1996  . Smokeless tobacco: Never Used  . Alcohol Use: No  . Drug Use: No  . Sexual Activity: Not on file   Other Topics Concern  . Not on file   Social History Narrative  . No narrative on file    Review of Systems: Review of Systems  Constitutional: Positive for fever, chills, malaise/fatigue and diaphoresis. Negative for weight loss.  Eyes: Negative for blurred vision and double vision.  Respiratory: Negative for cough.   Cardiovascular: Positive for orthopnea and leg swelling. Negative for chest pain, palpitations and claudication.  Gastrointestinal: Positive for heartburn. Negative for nausea, vomiting and abdominal pain.  Genitourinary: Negative for dysuria and urgency.  Musculoskeletal: Positive for myalgias. Negative for back pain, falls and neck pain.  Skin: Positive for rash. Negative for itching.  Neurological: Positive for dizziness, tingling, tremors, weakness and headaches. Negative for sensory change, speech change, focal weakness, seizures and loss of consciousness.  Endo/Heme/Allergies: Does not bruise/bleed easily.  Psychiatric/Behavioral: Negative for depression and memory loss. The patient is not nervous/anxious  and does not have insomnia.      Physical Exam: Blood pressure 129/65, pulse 104, temperature 103.6 F (39.8 C), temperature source Rectal, resp. rate 25, height 5\' 9"  (1.753 m), weight 102.286 kg (225 lb 8 oz), SpO2 97.00%. BP 129/65  Pulse 104  Temp(Src) 103.6 F (39.8 C) (Rectal)  Resp 25  Ht 5\' 9"  (1.753 m)  Wt 102.286 kg (225 lb 8 oz)  BMI 33.29 kg/m2  SpO2 97%  General Appearance:   Alert, cooperative, drowsy after dilaudid, appears stated age-skin is diffusely red  Head:    Normocephalic, without obvious abnormality, atraumatic  Eyes:    PERRL, conjunctiva/corneas clear, EOM's intact, fundi    benign, both eyes       Ears:    Normal  external ear canals, both ears  Nose:   Nares normal, septum midline, mucosa normal, no drainage    or sinus tenderness  Throat:   Lips, mucosa, and tongue normal; teeth and gums normal  Neck:   Supple, symmetrical, trachea midline, no adenopathy;       thyroid:  No enlargement/tenderness/nodules; no carotid   bruit or JVD     Lungs:     Clear to auscultation bilaterally, respirations unlabored  Chest wall:    No tenderness or deformity  Heart:    Slightly tachy and rhythm, S1 and S2 normal, no murmur, rub  or gallop  Abdomen:     Soft, non-tender, bowel sounds active all four quadrants,    no masses, no organomegaly  Genitalia:    Normal male without lesion, discharge or tenderness  Rectal:    Normal tone, normal prostate, no masses or tenderness;   guaiac negative stool  Extremities: Left lower extremity red, hot to touch, minimal swelling, no lymphangitis, palpable dp pulse, great tow nail is scabbed over along edge -slightly oozing  Pulses:   2+ and symmetric all extremities  Skin:   no distinct rashes or lesions  Lymph nodes:   Cervical, supraclavicular, and axillary nodes normal  Neurologic:   CNII-XII intact. Normal strength, sensation and reflexes      throughout    Lab results: Basic Metabolic Panel:  Recent Labs   09/01/13 1750  NA 136*  K 3.7  CL 95*  CO2 26  GLUCOSE 159*  BUN 20  CREATININE 1.01  CALCIUM 9.5   CBC:  Recent Labs  09/01/13 1750  WBC 16.8*  HGB 15.0  HCT 43.9  MCV 91.6  PLT 149*   Cardiac Enzymes: No results found for this basename: CKTOTAL, CKMB, CKMBINDEX, TROPONINI,  in the last 72 hours BNP: No results found for this basename: PROBNP,  in the last 72 hours D-Dimer: No results found for this basename: DDIMER,  in the last 72 hours CBG:  Recent Labs  09/01/13 1703  GLUCAP 187*   Urinalysis:  Recent Labs  09/01/13 2048  COLORURINE YELLOW  LABSPEC 1.024  PHURINE 5.0  GLUCOSEU NEGATIVE  HGBUR NEGATIVE  BILIRUBINUR NEGATIVE  KETONESUR NEGATIVE  PROTEINUR NEGATIVE  UROBILINOGEN 0.2  NITRITE NEGATIVE  LEUKOCYTESUR TRACE*    Imaging results:  Dg Chest Port 1 View  09/01/2013   CLINICAL DATA:  Dizziness, fever, history coronary artery disease post MI, stenting, and CABG, hypertension, former smoker, diabetes  EXAM: PORTABLE CHEST - 1 VIEW  COMPARISON:  Portable exam 2036 hr compared to 11/11/2012  FINDINGS: Enlargement of cardiac silhouette post CABG.  Mediastinal contours and pulmonary vascularity normal.  Right basilar atelectasis.  Lungs otherwise clear.  No pleural effusion or pneumothorax.  Sclerotic focus at proximal right humerus, noted on 04/03/2012 as well.  IMPRESSION: Enlargement of cardiac silhouette post CABG.  Right basilar atelectasis.   Electronically Signed   By: Ulyses Southward M.D.   On: 09/01/2013 20:44    Other results: EKG: normal EKG, normal sinus rhythm, unchanged from previous tracings.  Assessment & Plan by Problem:  1. Sepsis, early cellulitis, left lower extremity Likely etiology left lower extremity  cellulitis post-ingrown toe nail procedure. Prior lower extremity infections. Normal duplex dopplers in May 2014. No DM related complications/neuropathy. CXR normal and UA neg.  Will admit to stepdown with rising lactic acid,  tachycardia  Vancomycin and Zosyn IV  Blood cultures pending  IV fluids with caution, BP normal  Tylenol for fever  Monitor tacycardia  CBC in AM, Lactic acid  Treat extremity pain  2. HLD  Resume his statin  3. Sleep Apnea  Ok for him to use his home CPAP  4. CAD, prior CABG  Continue Beta Blocker, hold diuretic until sepsis ruled out/resolved  ASA, Statin  5. DM  HbA1C  SSI  All other chronic medical problems stable and being managed with home medication.  Dispo: Disposition is deferred at this time, awaiting improvement of current medical problems. Anticipated discharge in approximately 3-4day(s).   Goals of Care: Patient is a full code for now, his wife says he has a living will with specific instructions-she will bring this in for the medical record.  SignedAnderson Malta 09/01/2013, 11:58 PM

## 2013-09-01 NOTE — ED Notes (Signed)
Bednar made aware of redness to left leg and swelling.

## 2013-09-01 NOTE — ED Notes (Signed)
Dr Fonnie Jarvis given a copy of lactic acid results 2.75

## 2013-09-01 NOTE — ED Notes (Signed)
4th bag of NS started at 125/hr.  Unable to scan bag.

## 2013-09-01 NOTE — ED Notes (Signed)
Redness and swelling noted to left leg.  Redness extends from ankle to midcalf.

## 2013-09-01 NOTE — ED Notes (Signed)
After administration of dilaudid, pt's O2 sats dropped to 89% on RA.  Pt placed on 2L [place]; sats rosed to 92%.  Oxygen raised to 3L; pt currently sating 95-97%.

## 2013-09-01 NOTE — ED Notes (Signed)
Pt complaining of lower back pain and states that is not normal for him.  Revitalizing pt.  Noted white count and increased acuity and added lactic acid

## 2013-09-01 NOTE — ED Notes (Signed)
Pt here with chills and wife insisted that he needs to come.  Pt has a low-grade temp at triage.  No cough, sore throat, body aches.  Pt reports dizziness with standing.

## 2013-09-01 NOTE — ED Notes (Signed)
Pt is here with shivers and reports dizziness with standing.  Pt has been septic in the past and reports cardiac history

## 2013-09-01 NOTE — Progress Notes (Signed)
ANTIBIOTIC CONSULT NOTE - INITIAL  Pharmacy Consult for Vancomycin/Zosyn  Indication: Cellulitis  No Known Allergies  Patient Measurements: Height: 5\' 9"  (175.3 cm) Weight: 225 lb 8 oz (102.286 kg) IBW/kg (Calculated) : 70.7  Vital Signs: Temp: 103.6 F (39.8 C) (02/21 2158) Temp src: Rectal (02/21 2158) BP: 161/71 mmHg (02/21 2200) Pulse Rate: 107 (02/21 2200)  Labs:  Recent Labs  09/01/13 1750  WBC 16.8*  HGB 15.0  PLT 149*  CREATININE 1.01   Estimated Creatinine Clearance: 71 ml/min (by C-G formula based on Cr of 1.01).  Medical History: Past Medical History  Diagnosis Date  . Cough     chronic cough d/t sinusitis  . Diabetes mellitus     type II  . Hyperlipidemia   . Sleep apnea, obstructive     CPAP NIGHTLY  . Myocardial infarction 1997  . Hx of heart artery stent 1997    PTA  AND STENT X2 TO LAD,STENT TO CICUMFLEX  . Obesity, mild   . Hypertension    Assessment: 80 y/o M here with left leg cellulitis. WBC 16.8, renal function ok, Tmax 103.6, other labs as above.   ED Antibiotics Vancomycin 1000 mg IV x 1 at 2243 Zosyn 3.375g IV x 1 at 2230  Goal of Therapy:  Vancomycin trough level 15-20 mcg/ml, for now given high temp  Plan:  -Vancomycin 1250 mg IV q12h -Zosyn 3.375G IV q8h to be infused over 4 hours -Trend WBC, temp, renal function, cultures -Drug levels as indicated   Abran Duke 09/01/2013,10:42 PM

## 2013-09-01 NOTE — ED Provider Notes (Addendum)
CSN: [id number]     Arrival date & time 09/01/13  1650 History   First MD Initiated Contact with Patient 09/01/13 1952     Chief Complaint  Patient presents with  . Dizziness  . Fever     (Consider location/radiation/quality/duration/timing/severity/associated sxs/prior Treatment) HPI [age] year old male with fever and moderate generalized weakness today for several hours with mild lightheadedness when he stands up but still able to walk unassisted prior to arrival with no cough no dyspnea no confusion no headache no stiff neck no rash no vomiting no diarrhea no abdominal pain no treatment prior to arrival. No vertigo or focal weak/numb/incoordination. Past Medical History  Diagnosis Date  . Cough     chronic cough d/t sinusitis  . Diabetes mellitus     type II  . Hyperlipidemia   . Sleep apnea, obstructive     CPAP NIGHTLY  . Myocardial infarction 1997  . Hx of heart artery stent 1997    PTA  AND STENT X2 TO LAD,STENT TO CICUMFLEX  . Obesity, mild   . Hypertension    Past Surgical History  Procedure Laterality Date  . Heart bypass  nov 2012  . Cornea graft      bilateral eyes  . Cataract extraction      bilateral eyes  . Coronary artery bypass graft    . Cardiac catheterization     Family History  Problem Relation Age of Onset  . Colon cancer Mother    History  Substance Use Topics  . Smoking status: Former Smoker -- 1.00 packs/day for 45 years    Types: Cigarettes    Quit date: 05/19/1996  . Smokeless tobacco: Never Used  . Alcohol Use: No    Review of Systems 10 Systems reviewed and are negative for acute change except as noted in the HPI.   Allergies  Review of patient's allergies indicates no known allergies.  Home Medications   No current outpatient prescriptions on file. BP 128/46  Pulse 76  Temp(Src) 98.2 F (36.8 C) (Oral)  Resp 24  Ht 5\' 10"  (1.778 m)  Wt 227 lb 1.2 oz (103 kg)  BMI 32.58 kg/m2  SpO2 95% Physical Exam  Nursing note and  vitals reviewed. Constitutional:  Awake, alert, nontoxic appearance.  HENT:  Head: Atraumatic.  Eyes: Right eye exhibits no discharge. Left eye exhibits no discharge.  Neck: Neck supple.  Cardiovascular: Regular rhythm.   No murmur heard. Tachycardic  Pulmonary/Chest: Effort normal and breath sounds normal. No respiratory distress. He has no wheezes. He has no rales. He exhibits no tenderness.  Abdominal: Soft. Bowel sounds are normal. He exhibits no distension. There is no tenderness. There is no rebound and no guarding.  Musculoskeletal: He exhibits no edema and no tenderness.  Baseline ROM, no obvious new focal weakness.  Neurological: He is alert.  Mental status normal but motor strength appears generally weak and too weak to stand up in the ED.  Skin: No rash noted.  Psychiatric: He has a normal mood and affect.    ED Course  Procedures (including critical care time) Patient / Family / Caregiver understand and agree with initial ED impression and plan with expectations set for ED visit. While in ED Pt developed new onset left lower leg cellulitis with erythema/warmth/pain/induration without swelling, fluctuance, or subcut emphysema to suggest abscess or nec fasc. Pt stable in ED with no significant deterioration in condition.Patient / Family / Caregiver informed of clinical course, understand medical decision-making process, and agree with  plan. D/w Med for admit.  CRITICAL CARE Performed by: Hurman Horn Total critical care time: including multiple reassessment and d/w consultant, IVF resuscitation and antibiotics for sepsis once likely skin source apparent. Critical care time was exclusive of separately billable procedures and treating other patients. Critical care was necessary to treat or prevent imminent or life-threatening deterioration. Critical care was time spent personally by me on the following activities: development of treatment plan with patient and/or  surrogate as well as nursing, discussions with consultants, evaluation of patient's response to treatment, examination of patient, obtaining history from patient or surrogate, ordering and performing treatments and interventions, ordering and review of laboratory studies, ordering and review of radiographic studies, pulse oximetry and re-evaluation of patient's condition. Labs Review Labs Reviewed  BASIC METABOLIC PANEL - Abnormal; Notable for the following:    Sodium 136 (*)    Chloride 95 (*)    Glucose, Bld 159 (*)    GFR calc non Af Amer 69 (*)    GFR calc Af Amer 80 (*)    All other components within normal limits  CBC - Abnormal; Notable for the following:    WBC 16.8 (*)    Platelets 149 (*)    All other components within normal limits  URINALYSIS, ROUTINE W REFLEX MICROSCOPIC - Abnormal; Notable for the following:    Leukocytes, UA TRACE (*)    All other components within normal limits  URINE MICROSCOPIC-ADD ON - Abnormal; Notable for the following:    Squamous Epithelial / LPF FEW (*)    Bacteria, UA FEW (*)    Casts HYALINE CASTS (*)    All other components within normal limits  BASIC METABOLIC PANEL - Abnormal; Notable for the following:    Potassium 3.5 (*)    Glucose, Bld 136 (*)    Calcium 7.9 (*)    GFR calc non Af Amer 62 (*)    GFR calc Af Amer 71 (*)    All other components within normal limits  CBC - Abnormal; Notable for the following:    WBC 19.5 (*)    HCT 38.8 (*)    Platelets 127 (*)    All other components within normal limits  GLUCOSE, CAPILLARY - Abnormal; Notable for the following:    Glucose-Capillary 139 (*)    All other components within normal limits  CBC - Abnormal; Notable for the following:    WBC 17.3 (*)    Platelets 120 (*)    All other components within normal limits  BASIC METABOLIC PANEL - Abnormal; Notable for the following:    Glucose, Bld 137 (*)    Calcium 8.3 (*)    GFR calc non Af Amer 54 (*)    GFR calc Af Amer 62 (*)     All other components within normal limits  LACTIC ACID, PLASMA - Abnormal; Notable for the following:    Lactic Acid, Venous 3.2 (*)    All other components within normal limits  PRO B NATRIURETIC PEPTIDE - Abnormal; Notable for the following:    Pro B Natriuretic peptide (BNP) 2256.0 (*)    All other components within normal limits  GLUCOSE, CAPILLARY - Abnormal; Notable for the following:    Glucose-Capillary 136 (*)    All other components within normal limits  CBC - Abnormal; Notable for the following:    WBC 15.3 (*)    Platelets 124 (*)    All other components within normal limits  BASIC METABOLIC PANEL - Abnormal;  Notable for the following:    Glucose, Bld 133 (*)    Calcium 8.3 (*)    GFR calc non Af Amer 53 (*)    GFR calc Af Amer 62 (*)    All other components within normal limits  GLUCOSE, CAPILLARY - Abnormal; Notable for the following:    Glucose-Capillary 105 (*)    All other components within normal limits  GLUCOSE, CAPILLARY - Abnormal; Notable for the following:    Glucose-Capillary 135 (*)    All other components within normal limits  GLUCOSE, CAPILLARY - Abnormal; Notable for the following:    Glucose-Capillary 126 (*)    All other components within normal limits  GLUCOSE, CAPILLARY - Abnormal; Notable for the following:    Glucose-Capillary 124 (*)    All other components within normal limits  CBG MONITORING, ED - Abnormal; Notable for the following:    Glucose-Capillary 187 (*)    All other components within normal limits  I-STAT CG4 LACTIC ACID, ED - Abnormal; Notable for the following:    Lactic Acid, Venous 2.75 (*)    All other components within normal limits  I-STAT CG4 LACTIC ACID, ED - Abnormal; Notable for the following:    Lactic Acid, Venous 3.71 (*)    All other components within normal limits  CULTURE, BLOOD (ROUTINE X 2)  CULTURE, BLOOD (ROUTINE X 2)  MRSA PCR SCREENING  URINE CULTURE   Imaging Review Dg Chest Port 1 View  09/01/2013    CLINICAL DATA:  Dizziness, fever, history coronary artery disease post MI, stenting, and CABG, hypertension, former smoker, diabetes  EXAM: PORTABLE CHEST - 1 VIEW  COMPARISON:  Portable exam 2036 hr compared to 11/11/2012  FINDINGS: Enlargement of cardiac silhouette post CABG.  Mediastinal contours and pulmonary vascularity normal.  Right basilar atelectasis.  Lungs otherwise clear.  No pleural effusion or pneumothorax.  Sclerotic focus at proximal right humerus, noted on 04/03/2012 as well.  IMPRESSION: Enlargement of cardiac silhouette post CABG.  Right basilar atelectasis.   Electronically Signed   By: Ulyses Southward M.D.   On: 09/01/2013 20:44      MDM   Final diagnoses:  Sepsis  Cellulitis    The patient appears reasonably stabilized for admission considering the current resources, flow, and capabilities available in the ED at this time, and I doubt any other [facility] requiring further screening and/or treatment in the ED prior to admission.    Hurman Horn, MD 09/03/13 1407  Hurman Horn, MD 09/13/13 2049

## 2013-09-01 NOTE — ED Notes (Signed)
Dr. Fonnie Jarvis in to reassess pt.

## 2013-09-02 ENCOUNTER — Encounter (HOSPITAL_COMMUNITY): Payer: Self-pay | Admitting: *Deleted

## 2013-09-02 DIAGNOSIS — G4733 Obstructive sleep apnea (adult) (pediatric): Secondary | ICD-10-CM

## 2013-09-02 DIAGNOSIS — L0291 Cutaneous abscess, unspecified: Secondary | ICD-10-CM

## 2013-09-02 DIAGNOSIS — L0390 Cellulitis, unspecified: Secondary | ICD-10-CM

## 2013-09-02 DIAGNOSIS — A419 Sepsis, unspecified organism: Secondary | ICD-10-CM

## 2013-09-02 DIAGNOSIS — I509 Heart failure, unspecified: Secondary | ICD-10-CM

## 2013-09-02 DIAGNOSIS — I5032 Chronic diastolic (congestive) heart failure: Secondary | ICD-10-CM

## 2013-09-02 DIAGNOSIS — M109 Gout, unspecified: Secondary | ICD-10-CM | POA: Diagnosis present

## 2013-09-02 DIAGNOSIS — I10 Essential (primary) hypertension: Secondary | ICD-10-CM | POA: Diagnosis present

## 2013-09-02 LAB — BASIC METABOLIC PANEL
BUN: 19 mg/dL (ref 6–23)
BUN: 20 mg/dL (ref 6–23)
CALCIUM: 8.3 mg/dL — AB (ref 8.4–10.5)
CHLORIDE: 101 meq/L (ref 96–112)
CO2: 22 meq/L (ref 19–32)
CO2: 25 mEq/L (ref 19–32)
CREATININE: 1.11 mg/dL (ref 0.50–1.35)
CREATININE: 1.24 mg/dL (ref 0.50–1.35)
Calcium: 7.9 mg/dL — ABNORMAL LOW (ref 8.4–10.5)
Chloride: 102 mEq/L (ref 96–112)
GFR calc Af Amer: 71 mL/min — ABNORMAL LOW (ref >90)
GFR calc non Af Amer: 62 mL/min — ABNORMAL LOW (ref >90)
GFR, EST AFRICAN AMERICAN: 62 mL/min — AB (ref >90)
GFR, EST NON AFRICAN AMERICAN: 54 mL/min — AB (ref >90)
GLUCOSE: 137 mg/dL — AB (ref 70–99)
Glucose, Bld: 136 mg/dL — ABNORMAL HIGH (ref 70–99)
POTASSIUM: 3.5 meq/L — AB (ref 3.7–5.3)
Potassium: 3.7 mEq/L (ref 3.7–5.3)
Sodium: 138 mEq/L (ref 137–147)
Sodium: 141 mEq/L (ref 137–147)

## 2013-09-02 LAB — GLUCOSE, CAPILLARY
GLUCOSE-CAPILLARY: 139 mg/dL — AB (ref 70–99)
Glucose-Capillary: 136 mg/dL — ABNORMAL HIGH (ref 70–99)
Glucose-Capillary: 105 mg/dL — ABNORMAL HIGH (ref 70–99)
Glucose-Capillary: 135 mg/dL — ABNORMAL HIGH (ref 70–99)

## 2013-09-02 LAB — CBC
HEMATOCRIT: 38.8 % — AB (ref 39.0–52.0)
HEMATOCRIT: 39.5 % (ref 39.0–52.0)
HEMOGLOBIN: 13.1 g/dL (ref 13.0–17.0)
Hemoglobin: 13.4 g/dL (ref 13.0–17.0)
MCH: 31 pg (ref 26.0–34.0)
MCH: 31.2 pg (ref 26.0–34.0)
MCHC: 33.8 g/dL (ref 30.0–36.0)
MCHC: 33.9 g/dL (ref 30.0–36.0)
MCV: 91.9 fL (ref 78.0–100.0)
MCV: 91.9 fL (ref 78.0–100.0)
Platelets: 127 10*3/uL — ABNORMAL LOW (ref 150–400)
Platelets: 120 10*3/uL — ABNORMAL LOW (ref 150–400)
RBC: 4.22 MIL/uL (ref 4.22–5.81)
RBC: 4.3 MIL/uL (ref 4.22–5.81)
RDW: 14.5 % (ref 11.5–15.5)
RDW: 14.9 % (ref 11.5–15.5)
WBC: 19.5 10*3/uL — AB (ref 4.0–10.5)
WBC: 17.3 10*3/uL — ABNORMAL HIGH (ref 4.0–10.5)

## 2013-09-02 LAB — LACTIC ACID, PLASMA: Lactic Acid, Venous: 3.2 mmol/L — ABNORMAL HIGH (ref 0.5–2.2)

## 2013-09-02 LAB — MRSA PCR SCREENING: MRSA by PCR: NEGATIVE

## 2013-09-02 LAB — PRO B NATRIURETIC PEPTIDE: Pro B Natriuretic peptide (BNP): 2256 pg/mL — ABNORMAL HIGH (ref 0–450)

## 2013-09-02 MED ORDER — SODIUM CHLORIDE 0.9 % IV SOLN
INTRAVENOUS | Status: DC
  Administered 2013-09-02: 02:00:00 via INTRAVENOUS

## 2013-09-02 MED ORDER — HYPROMELLOSE (GONIOSCOPIC) 2.5 % OP SOLN: 1.0000 [drp] | Freq: Every day | OPHTHALMIC | Status: DC | PRN

## 2013-09-02 MED ORDER — PANTOPRAZOLE SODIUM 40 MG PO TBEC
40.0000 mg | DELAYED_RELEASE_TABLET | Freq: Every day | ORAL | Status: DC
  Administered 2013-09-02 – 2013-09-07 (×6): 40 mg via ORAL
  Filled 2013-09-02 (×6): qty 1

## 2013-09-02 MED ORDER — SODIUM CHLORIDE 0.9 % IV SOLN: INTRAVENOUS | Status: DC

## 2013-09-02 MED ORDER — ACETAMINOPHEN 500 MG PO TABS
1000.0000 mg | ORAL_TABLET | Freq: Four times a day (QID) | ORAL | Status: DC | PRN
  Administered 2013-09-03 – 2013-09-04 (×2): 1000 mg via ORAL
  Filled 2013-09-02 (×2): qty 2

## 2013-09-02 MED ORDER — POLYVINYL ALCOHOL 1.4 % OP SOLN
1.0000 [drp] | OPHTHALMIC | Status: DC | PRN
  Filled 2013-09-02: qty 15

## 2013-09-02 MED ORDER — LUBRICANT EYE OP OINT: 1.0000 "application " | TOPICAL_OINTMENT | Freq: Every evening | OPHTHALMIC | Status: DC | PRN

## 2013-09-02 MED ORDER — IPRATROPIUM-ALBUTEROL 0.5-2.5 (3) MG/3ML IN SOLN: 3.0000 mL | Freq: Four times a day (QID) | RESPIRATORY_TRACT | Status: DC | PRN

## 2013-09-02 MED ORDER — ALUM & MAG HYDROXIDE-SIMETH 200-200-20 MG/5ML PO SUSP: 30.0000 mL | Freq: Four times a day (QID) | ORAL | Status: DC | PRN

## 2013-09-02 MED ORDER — IPRATROPIUM-ALBUTEROL 20-100 MCG/ACT IN AERS: 1.0000 | INHALATION_SPRAY | Freq: Four times a day (QID) | RESPIRATORY_TRACT | Status: DC | PRN

## 2013-09-02 MED ORDER — LORATADINE 10 MG PO TABS
10.0000 mg | ORAL_TABLET | Freq: Every day | ORAL | Status: DC
  Administered 2013-09-02 – 2013-09-07 (×6): 10 mg via ORAL
  Filled 2013-09-02 (×6): qty 1

## 2013-09-02 MED ORDER — INSULIN ASPART 100 UNIT/ML ~~LOC~~ SOLN: 0.0000 [IU] | Freq: Every day | SUBCUTANEOUS | Status: DC

## 2013-09-02 MED ORDER — VITAMIN C 500 MG PO TABS
1000.0000 mg | ORAL_TABLET | Freq: Every day | ORAL | Status: DC
  Administered 2013-09-02 – 2013-09-07 (×6): 1000 mg via ORAL
  Filled 2013-09-02 (×7): qty 2

## 2013-09-02 MED ORDER — ASPIRIN 81 MG PO TABS: 81.0000 mg | ORAL_TABLET | Freq: Two times a day (BID) | ORAL | Status: DC

## 2013-09-02 MED ORDER — FUROSEMIDE 20 MG PO TABS
20.0000 mg | ORAL_TABLET | Freq: Two times a day (BID) | ORAL | Status: DC
  Administered 2013-09-02 – 2013-09-05 (×6): 20 mg via ORAL
  Filled 2013-09-02 (×9): qty 1

## 2013-09-02 MED ORDER — INSULIN ASPART 100 UNIT/ML ~~LOC~~ SOLN
4.0000 [IU] | Freq: Three times a day (TID) | SUBCUTANEOUS | Status: DC
  Administered 2013-09-02 – 2013-09-07 (×15): 4 [IU] via SUBCUTANEOUS

## 2013-09-02 MED ORDER — METOPROLOL TARTRATE 50 MG PO TABS
75.0000 mg | ORAL_TABLET | Freq: Two times a day (BID) | ORAL | Status: DC
  Administered 2013-09-02 – 2013-09-07 (×11): 75 mg via ORAL
  Filled 2013-09-02 (×12): qty 1

## 2013-09-02 MED ORDER — FISH OIL + D3 1200-1000 MG-UNIT PO CAPS: 1.0000 | ORAL_CAPSULE | ORAL | Status: DC

## 2013-09-02 MED ORDER — ENOXAPARIN SODIUM 40 MG/0.4ML ~~LOC~~ SOLN
40.0000 mg | SUBCUTANEOUS | Status: DC
  Administered 2013-09-02 – 2013-09-07 (×6): 40 mg via SUBCUTANEOUS
  Filled 2013-09-02 (×6): qty 0.4

## 2013-09-02 MED ORDER — INSULIN ASPART 100 UNIT/ML ~~LOC~~ SOLN
0.0000 [IU] | Freq: Three times a day (TID) | SUBCUTANEOUS | Status: DC
  Administered 2013-09-02 – 2013-09-05 (×8): 2 [IU] via SUBCUTANEOUS
  Administered 2013-09-06: 3 [IU] via SUBCUTANEOUS
  Administered 2013-09-06 – 2013-09-07 (×2): 2 [IU] via SUBCUTANEOUS

## 2013-09-02 MED ORDER — DOCUSATE SODIUM 100 MG PO CAPS
100.0000 mg | ORAL_CAPSULE | Freq: Two times a day (BID) | ORAL | Status: DC
  Administered 2013-09-02 – 2013-09-07 (×11): 100 mg via ORAL
  Filled 2013-09-02 (×11): qty 1

## 2013-09-02 MED ORDER — ASPIRIN EC 81 MG PO TBEC
81.0000 mg | DELAYED_RELEASE_TABLET | Freq: Two times a day (BID) | ORAL | Status: DC
  Administered 2013-09-02 – 2013-09-07 (×11): 81 mg via ORAL
  Filled 2013-09-02 (×12): qty 1

## 2013-09-02 MED ORDER — SENNOSIDES-DOCUSATE SODIUM 8.6-50 MG PO TABS: 1.0000 | ORAL_TABLET | Freq: Every evening | ORAL | Status: DC | PRN

## 2013-09-02 MED ORDER — ARTIFICIAL TEARS OP OINT: TOPICAL_OINTMENT | Freq: Every evening | OPHTHALMIC | Status: DC | PRN

## 2013-09-02 MED ORDER — ATORVASTATIN CALCIUM 80 MG PO TABS
80.0000 mg | ORAL_TABLET | Freq: Every day | ORAL | Status: DC
  Administered 2013-09-02 – 2013-09-06 (×5): 80 mg via ORAL
  Filled 2013-09-02 (×8): qty 1

## 2013-09-02 MED ORDER — VITAMIN D3 25 MCG (1000 UNIT) PO TABS
1000.0000 [IU] | ORAL_TABLET | Freq: Every day | ORAL | Status: DC
  Administered 2013-09-02 – 2013-09-07 (×6): 1000 [IU] via ORAL
  Filled 2013-09-02 (×6): qty 1

## 2013-09-02 MED ORDER — HYDROMORPHONE HCL PF 1 MG/ML IJ SOLN: 0.5000 mg | INTRAMUSCULAR | Status: DC | PRN

## 2013-09-02 MED ORDER — ONDANSETRON HCL 4 MG/2ML IJ SOLN: 4.0000 mg | Freq: Four times a day (QID) | INTRAMUSCULAR | Status: DC | PRN

## 2013-09-02 MED ORDER — OMEGA-3-ACID ETHYL ESTERS 1 G PO CAPS
1.0000 g | ORAL_CAPSULE | Freq: Every day | ORAL | Status: DC
  Administered 2013-09-02 – 2013-09-07 (×6): 1 g via ORAL
  Filled 2013-09-02 (×6): qty 1

## 2013-09-02 MED ORDER — PREDNISOLONE ACETATE 1 % OP SUSP
1.0000 [drp] | Freq: Every day | OPHTHALMIC | Status: DC
  Administered 2013-09-02 – 2013-09-07 (×6): 1 [drp] via OPHTHALMIC
  Filled 2013-09-02: qty 1

## 2013-09-02 MED ORDER — SODIUM CHLORIDE 0.9 % IJ SOLN
3.0000 mL | Freq: Two times a day (BID) | INTRAMUSCULAR | Status: DC
  Administered 2013-09-02 – 2013-09-07 (×9): 3 mL via INTRAVENOUS

## 2013-09-02 MED ORDER — ACETAMINOPHEN 650 MG RE SUPP
650.0000 mg | Freq: Once | RECTAL | Status: AC
  Administered 2013-09-02: 650 mg via RECTAL
  Filled 2013-09-02: qty 1

## 2013-09-02 MED ORDER — ONDANSETRON HCL 4 MG PO TABS: 4.0000 mg | ORAL_TABLET | Freq: Four times a day (QID) | ORAL | Status: DC | PRN

## 2013-09-02 MED ORDER — FLUTICASONE PROPIONATE 50 MCG/ACT NA SUSP
2.0000 | Freq: Every day | NASAL | Status: DC
  Administered 2013-09-02 – 2013-09-07 (×5): 2 via NASAL
  Filled 2013-09-02: qty 16

## 2013-09-02 MED ORDER — OXYCODONE HCL 5 MG PO TABS
5.0000 mg | ORAL_TABLET | ORAL | Status: DC | PRN
  Administered 2013-09-02 – 2013-09-07 (×2): 5 mg via ORAL
  Filled 2013-09-02 (×2): qty 1

## 2013-09-02 MED ORDER — ALLOPURINOL 300 MG PO TABS
300.0000 mg | ORAL_TABLET | Freq: Every day | ORAL | Status: DC
  Administered 2013-09-02 – 2013-09-07 (×6): 300 mg via ORAL
  Filled 2013-09-02 (×6): qty 1

## 2013-09-02 NOTE — Progress Notes (Signed)
Pt. Has already placed himself on his home CPAP. CPAP is set at 13cm H2O & pt. Has his FFM from home. CPAP was checked for frayed wires & none were found. Pt. Is tolerating CPAP well at this time without any complications.

## 2013-09-02 NOTE — Progress Notes (Signed)
[facility] PROGRESS NOTE  Victor Morgan UUV:[id number] DOB: 1932-11-19 DOA: 09/01/2013 PCP: Pamelia Hoit, MD  Assessment/Plan: Active Problems:   Hyperlipidemia: Continue statin    Sleep apnea, obstructive- on C Pap at home.: Continue home CPAP machine here    Type II or unspecified type diabetes mellitus with unspecified complication, uncontrolled: Checking A1c, sliding scale    Sepsis: Labs early morning slightly worse. However, he clinically looks better. Repeat lactic acid and lab work. Hopefully white count and lactic acid level will improve. If worse, will check imaging of foot.    Gout: Stable   HTN (hypertension): Stable. Continue beta blocker, holding diuretic for now    Diastolic CHF, chronic: By echocardiogram done last year patient has grade 2 diastolic dysfunction. Has stopped IV fluids and checking BNP.  Cellulitis left foot: See above Code Status: Full code  Family Communication: Will speak with patient's wife later today.  Disposition Plan: Recheck lab work this morning and if significantly improved, can transfer to floor   Consultants:  None  Procedures:  None  Antibiotics:  IV vancomycin and Zosyn 2/21-present  HPI/Subjective: Patient felt okay. Feeling better than yesterday.  Objective: Filed Vitals:   09/02/13 0927  BP: 128/46  Pulse: 83  Temp: 98 F (36.7 C)  Resp: 24    Intake/Output Summary (Last 24 hours) at 09/02/13 1025 Last data filed at 09/02/13 0700  Gross per 24 hour  Intake 1170.83 ml  Output    500 ml  Net 670.83 ml   Filed Weights   09/01/13 1703 09/02/13 0149  Weight: 102.286 kg (225 lb 8 oz) 104 kg (229 lb 4.5 oz)    Exam:   General:  Alert and oriented x3, no acute distress  Cardiovascular: Regular rate and rhythm, S1-S2  Respiratory: Clear to auscultation bilaterally  Abdomen: Soft, nontender, nondistended, positive bowel sounds  Musculoskeletal: Trace pitting edema bilaterally, left leg is  warm and slightly erythematous on the anterior aspect mid calf down, with the foot itself being distally dusky with cool toe   Data Reviewed: Basic Metabolic Panel:  Recent Labs Lab 09/01/13 1750 09/02/13 0315  NA 136* 138  K 3.7 3.5*  CL 95* 101  CO2 26 22  GLUCOSE 159* 136*  BUN 20 19  CREATININE 1.01 1.11  CALCIUM 9.5 7.9*   CBC:  Recent Labs Lab 09/01/13 1750 09/02/13 0315  WBC 16.8* 19.5*  HGB 15.0 13.1  HCT 43.9 38.8*  MCV 91.6 91.9  PLT 149* 127*   Cardiac Enzymes: No results found for this basename: CKTOTAL, CKMB, CKMBINDEX, TROPONINI,  in the last 168 hours BNP (last 3 results) No results found for this basename: PROBNP,  in the last 8760 hours CBG:  Recent Labs Lab 09/01/13 1703 09/02/13 0847  GLUCAP 187* 139*    Recent Results (from the past 240 hour(s))  MRSA PCR SCREENING     Status: None   Collection Time    09/02/13  1:46 AM      Result Value Ref Range Status   MRSA by PCR NEGATIVE  NEGATIVE Final   Comment:            The GeneXpert MRSA Assay (FDA     approved for NASAL specimens     only), is one component of a     comprehensive MRSA colonization     surveillance program. It is not     intended to diagnose MRSA     infection nor to guide or  monitor treatment for     MRSA infections.     Studies: Dg Chest Port 1 View  09/01/2013   CLINICAL DATA:  Dizziness, fever, history coronary artery disease post MI, stenting, and CABG, hypertension, former smoker, diabetes  EXAM: PORTABLE CHEST - 1 VIEW  COMPARISON:  Portable exam 2036 hr compared to 11/11/2012  FINDINGS: Enlargement of cardiac silhouette post CABG.  Mediastinal contours and pulmonary vascularity normal.  Right basilar atelectasis.  Lungs otherwise clear.  No pleural effusion or pneumothorax.  Sclerotic focus at proximal right humerus, noted on 04/03/2012 as well.  IMPRESSION: Enlargement of cardiac silhouette post CABG.  Right basilar atelectasis.   Electronically Signed   By:  Ulyses SouthwardMark  Boles M.D.   On: 09/01/2013 20:44    Scheduled Meds: . allopurinol  300 mg Oral Daily  . aspirin EC  81 mg Oral BID  . atorvastatin  80 mg Oral q1800  . cholecalciferol  1,000 Units Oral Daily  . docusate sodium  100 mg Oral BID  . enoxaparin (LOVENOX) injection  40 mg Subcutaneous Q24H  . fluticasone  2 spray Each Nare Daily  . insulin aspart  0-15 Units Subcutaneous TID WC  . insulin aspart  0-5 Units Subcutaneous QHS  . insulin aspart  4 Units Subcutaneous TID WC  . loratadine  10 mg Oral Daily  . metoprolol  75 mg Oral BID  . omega-3 acid ethyl esters  1 g Oral Daily  . pantoprazole  40 mg Oral Daily  . piperacillin-tazobactam (ZOSYN)  IV  3.375 g Intravenous 3 times per day  . prednisoLONE acetate  1 drop Left Eye Daily  . sodium chloride  3 mL Intravenous Q12H  . vancomycin  1,250 mg Intravenous Q12H  . ascorbic acid  1,000 mg Oral Daily   Continuous Infusions:   Active Problems:   Hyperlipidemia   Sleep apnea, obstructive- on C Pap at home.   Type II or unspecified type diabetes mellitus with unspecified complication, uncontrolled   Sepsis   Gout   HTN (hypertension)   Diastolic CHF, chronic    Time spent: 40 minutes    Hollice EspyKRISHNAN,SENDIL K  [facility] Pager [phone]. If 7PM-7AM, please contact night-coverage at [email or web address], password [email or web address] 09/02/2013, 10:25 AM  LOS: 1 day

## 2013-09-03 DIAGNOSIS — E1165 Type 2 diabetes mellitus with hyperglycemia: Secondary | ICD-10-CM

## 2013-09-03 DIAGNOSIS — E118 Type 2 diabetes mellitus with unspecified complications: Secondary | ICD-10-CM

## 2013-09-03 DIAGNOSIS — IMO0002 Reserved for concepts with insufficient information to code with codable children: Secondary | ICD-10-CM

## 2013-09-03 LAB — CBC
HCT: 39.7 % (ref 39.0–52.0)
HEMOGLOBIN: 13.3 g/dL (ref 13.0–17.0)
MCH: 31.1 pg (ref 26.0–34.0)
MCHC: 33.5 g/dL (ref 30.0–36.0)
MCV: 92.8 fL (ref 78.0–100.0)
Platelets: 124 10*3/uL — ABNORMAL LOW (ref 150–400)
RBC: 4.28 MIL/uL (ref 4.22–5.81)
RDW: 14.9 % (ref 11.5–15.5)
WBC: 15.3 10*3/uL — ABNORMAL HIGH (ref 4.0–10.5)

## 2013-09-03 LAB — URINE CULTURE: Colony Count: 30000

## 2013-09-03 LAB — GLUCOSE, CAPILLARY
GLUCOSE-CAPILLARY: 126 mg/dL — AB (ref 70–99)
GLUCOSE-CAPILLARY: 120 mg/dL — AB (ref 70–99)
Glucose-Capillary: 124 mg/dL — ABNORMAL HIGH (ref 70–99)
Glucose-Capillary: 112 mg/dL — ABNORMAL HIGH (ref 70–99)

## 2013-09-03 LAB — BASIC METABOLIC PANEL
BUN: 18 mg/dL (ref 6–23)
CALCIUM: 8.3 mg/dL — AB (ref 8.4–10.5)
CO2: 27 mEq/L (ref 19–32)
Chloride: 103 mEq/L (ref 96–112)
Creatinine, Ser: 1.25 mg/dL (ref 0.50–1.35)
GFR calc Af Amer: 62 mL/min — ABNORMAL LOW (ref >90)
GFR calc non Af Amer: 53 mL/min — ABNORMAL LOW (ref >90)
GLUCOSE: 133 mg/dL — AB (ref 70–99)
POTASSIUM: 3.8 meq/L (ref 3.7–5.3)
Sodium: 143 mEq/L (ref 137–147)

## 2013-09-03 NOTE — Care Management Note (Signed)
    Page 1 of 1   09/03/2013     11:15:07 AM   CARE MANAGEMENT NOTE 09/03/2013  Patient:  Victor Morgan, Victor Morgan   Account Number:  [id number]  Date Initiated:  09/03/2013  Documentation initiated by:  Junius Creamer  Subjective/Objective Assessment:   adm w sepsis     Action/Plan:   lives w wife, pcp dr Benedetto Goad   Anticipated DC Date:     Anticipated DC Plan:           Choice offered to / List presented to:             Status of service:   Medicare Important Message given?   (If response is "NO", the following Medicare IM given date fields will be blank) Date Medicare IM given:   Date Additional Medicare IM given:    Discharge Disposition:    Per UR Regulation:  Reviewed for med. necessity/level of care/duration of stay  If discussed at Long Length of Stay Meetings, dates discussed:    Comments:

## 2013-09-03 NOTE — Progress Notes (Signed)
[facility] PROGRESS NOTE  Victor ReichertWilliam E Morgan WUJ:[id number] DOB: June 19, 1933 DOA: 09/01/2013 PCP: Pamelia HoitWILSON,FRED HENRY, MD  Assessment/Plan: Active Problems:   Hyperlipidemia: Continue statin    Sleep apnea, obstructive- on C Pap at home.: Continue home CPAP machine here    Type II or unspecified type diabetes mellitus with unspecified complication, uncontrolled: Checking A1c, sliding scale    Sepsis: Labs early morning slightly worse. However, he clinically looks better. Repeat lactic acid and lab work. WBC cont to improve.  Transfer to floor.  Blood cultures + for Grp B strep.    Gout: Stable   HTN (hypertension): Stable. Continue beta blocker, holding diuretic for now    Diastolic CHF, chronic: By echocardiogram done last year patient has grade 2 diastolic dysfunction. BNP mildly elevated, monitoring I's & O's-weight inly up 2200  Cellulitis left foot: See above Code Status: Full code  Family Communication: Left message with wife  Disposition Plan: Transfer to floor   Consultants:  None  Procedures:  None  Antibiotics:  IV vancomycin and Zosyn 2/21-present  HPI/Subjective: Patient felt okay. Decreased pain, swelling & erythema  Objective: Filed Vitals:   09/03/13 1236  BP: 128/46  Pulse: 76  Temp: 98.2 F (36.8 C)  Resp: 24    Intake/Output Summary (Last 24 hours) at 09/03/13 1545 Last data filed at 09/03/13 1358  Gross per 24 hour  Intake   1610 ml  Output   2625 ml  Net  -1015 ml   Filed Weights   09/01/13 1703 09/02/13 0149 09/03/13 0500  Weight: 102.286 kg (225 lb 8 oz) 104 kg (229 lb 4.5 oz) 103 kg (227 lb 1.2 oz)    Exam:   General:  Alert and oriented x3, no acute distress  Cardiovascular: Regular rate and rhythm, S1-S2  Respiratory: Clear to auscultation bilaterally  Abdomen: Soft, nontender, nondistended, positive bowel sounds  Musculoskeletal: Trace pitting edema bilaterally, left leg is warm and slightly erythematous on the  anterior aspect mid calf down, with the foot itself being distally dusky with cool toe.  Overall, erythema is decreased.    Data Reviewed: Basic Metabolic Panel:  Recent Labs Lab 09/01/13 1750 09/02/13 0315 09/02/13 1154 09/03/13 0343  NA 136* 138 141 143  K 3.7 3.5* 3.7 3.8  CL 95* 101 102 103  CO2 26 22 25 27   GLUCOSE 159* 136* 137* 133*  BUN 20 19 20 18   CREATININE 1.01 1.11 1.24 1.25  CALCIUM 9.5 7.9* 8.3* 8.3*   CBC:  Recent Labs Lab 09/01/13 1750 09/02/13 0315 09/02/13 1154 09/03/13 0343  WBC 16.8* 19.5* 17.3* 15.3*  HGB 15.0 13.1 13.4 13.3  HCT 43.9 38.8* 39.5 39.7  MCV 91.6 91.9 91.9 92.8  PLT 149* 127* 120* 124*   Cardiac Enzymes: No results found for this basename: CKTOTAL, CKMB, CKMBINDEX, TROPONINI,  in the last 168 hours BNP (last 3 results)  Recent Labs  09/02/13 1154  PROBNP 2256.0*   CBG:  Recent Labs Lab 09/02/13 1219 09/02/13 1652 09/02/13 2111 09/03/13 0822 09/03/13 1235  GLUCAP 136* 105* 135* 126* 124*    Recent Results (from the past 240 hour(s))  CULTURE, BLOOD (ROUTINE X 2)     Status: None   Collection Time    09/01/13  8:29 PM      Result Value Ref Range Status   Specimen Description BLOOD RIGHT FOREARM   Final   Special Requests BOTTLES DRAWN AEROBIC AND ANAEROBIC 10CC EACH   Final   Culture  Setup Time  Final   Value: 09/02/2013 02:18     Performed at [facility]   Culture     Final   Value: GROUP B STREP(S.AGALACTIAE)ISOLATED     Note: Gram Stain Report Called to,Read Back By and Verified With: ANGELA BARLOW 09/02/13 @ 2:04PM BY RUSCOE A.     Performed at [facility]   Report Status PENDING   Incomplete  CULTURE, BLOOD (ROUTINE X 2)     Status: None   Collection Time    09/01/13  8:34 PM      Result Value Ref Range Status   Specimen Description BLOOD LEFT ARM   Final   Special Requests     Final   Value: BOTTLES DRAWN AEROBIC AND ANAEROBIC 10CC BLUE,5CC RED   Culture  Setup Time     Final    Value: 09/02/2013 02:18     Performed at [facility]   Culture     Final   Value: GROUP B STREP(S.AGALACTIAE)ISOLATED     Note: Gram Stain Report Called to,Read Back By and Verified With: ANGELA BARLOW 09/02/13 @ 2:04PM BY RUSCOE A.     Performed at [facility]   Report Status PENDING   Incomplete  URINE CULTURE     Status: None   Collection Time    09/01/13  8:48 PM      Result Value Ref Range Status   Specimen Description URINE, CLEAN CATCH   Final   Special Requests NONE   Final   Culture  Setup Time     Final   Value: 09/02/2013 02:54     Performed at [facility] Count     Final   Value: 30,000 COLONIES/ML     Performed at [facility]   Culture     Final   Value: GROUP B STREP(S.AGALACTIAE)ISOLATED     Note: TESTING AGAINST S. AGALACTIAE NOT ROUTINELY PERFORMED DUE TO PREDICTABILITY OF AMP/PEN/VAN SUSCEPTIBILITY.     Performed at [facility]   Report Status 09/03/2013 FINAL   Final  MRSA PCR SCREENING     Status: None   Collection Time    09/02/13  1:46 AM      Result Value Ref Range Status   MRSA by PCR NEGATIVE  NEGATIVE Final   Comment:            The GeneXpert MRSA Assay (FDA     approved for NASAL specimens     only), is one component of a     comprehensive MRSA colonization     surveillance program. It is not     intended to diagnose MRSA     infection nor to guide or     monitor treatment for     MRSA infections.     Studies: Dg Chest Port 1 View  09/01/2013   CLINICAL DATA:  Dizziness, fever, history coronary artery disease post MI, stenting, and CABG, hypertension, former smoker, diabetes  EXAM: PORTABLE CHEST - 1 VIEW  COMPARISON:  Portable exam 2036 hr compared to 11/11/2012  FINDINGS: Enlargement of cardiac silhouette post CABG.  Mediastinal contours and pulmonary vascularity normal.  Right basilar atelectasis.  Lungs otherwise clear.  No pleural effusion or pneumothorax.  Sclerotic focus at proximal right  humerus, noted on 04/03/2012 as well.  IMPRESSION: Enlargement of cardiac silhouette post CABG.  Right basilar atelectasis.   Electronically Signed   By: Ulyses Southward M.D.   On: 09/01/2013 20:44  Scheduled Meds: . allopurinol  300 mg Oral Daily  . aspirin EC  81 mg Oral BID  . atorvastatin  80 mg Oral q1800  . cholecalciferol  1,000 Units Oral Daily  . docusate sodium  100 mg Oral BID  . enoxaparin (LOVENOX) injection  40 mg Subcutaneous Q24H  . fluticasone  2 spray Each Nare Daily  . furosemide  20 mg Oral BID  . insulin aspart  0-15 Units Subcutaneous TID WC  . insulin aspart  0-5 Units Subcutaneous QHS  . insulin aspart  4 Units Subcutaneous TID WC  . loratadine  10 mg Oral Daily  . metoprolol  75 mg Oral BID  . omega-3 acid ethyl esters  1 g Oral Daily  . pantoprazole  40 mg Oral Daily  . piperacillin-tazobactam (ZOSYN)  IV  3.375 g Intravenous 3 times per day  . prednisoLONE acetate  1 drop Left Eye Daily  . sodium chloride  3 mL Intravenous Q12H  . vancomycin  1,250 mg Intravenous Q12H  . ascorbic acid  1,000 mg Oral Daily   Continuous Infusions: . sodium chloride Stopped (09/03/13 1200)    Active Problems:   Hyperlipidemia   Sleep apnea, obstructive- on C Pap at home.   Type II or unspecified type diabetes mellitus with unspecified complication, uncontrolled   Sepsis   Gout   HTN (hypertension)   Diastolic CHF, chronic    Time spent: 30 minutes    Hollice Espy  [facility] Pager [phone]. If 7PM-7AM, please contact night-coverage at [email or web address], password [facility] 09/03/2013, 3:45 PM  LOS: 2 days

## 2013-09-04 DIAGNOSIS — I10 Essential (primary) hypertension: Secondary | ICD-10-CM

## 2013-09-04 DIAGNOSIS — IMO0002 Reserved for concepts with insufficient information to code with codable children: Secondary | ICD-10-CM

## 2013-09-04 DIAGNOSIS — A419 Sepsis, unspecified organism: Secondary | ICD-10-CM

## 2013-09-04 DIAGNOSIS — E1165 Type 2 diabetes mellitus with hyperglycemia: Secondary | ICD-10-CM

## 2013-09-04 DIAGNOSIS — E118 Type 2 diabetes mellitus with unspecified complications: Secondary | ICD-10-CM

## 2013-09-04 DIAGNOSIS — L0291 Cutaneous abscess, unspecified: Secondary | ICD-10-CM

## 2013-09-04 DIAGNOSIS — L0390 Cellulitis, unspecified: Secondary | ICD-10-CM

## 2013-09-04 LAB — CULTURE, BLOOD (ROUTINE X 2)

## 2013-09-04 LAB — CBC
HCT: 38 % — ABNORMAL LOW (ref 39.0–52.0)
HEMOGLOBIN: 13 g/dL (ref 13.0–17.0)
MCH: 31.1 pg (ref 26.0–34.0)
MCHC: 34.2 g/dL (ref 30.0–36.0)
MCV: 90.9 fL (ref 78.0–100.0)
Platelets: 117 10*3/uL — ABNORMAL LOW (ref 150–400)
RBC: 4.18 MIL/uL — ABNORMAL LOW (ref 4.22–5.81)
RDW: 14.4 % (ref 11.5–15.5)
WBC: 12.6 10*3/uL — ABNORMAL HIGH (ref 4.0–10.5)

## 2013-09-04 LAB — BASIC METABOLIC PANEL
BUN: 14 mg/dL (ref 6–23)
CHLORIDE: 99 meq/L (ref 96–112)
CO2: 29 mEq/L (ref 19–32)
Calcium: 8.6 mg/dL (ref 8.4–10.5)
Creatinine, Ser: 1.15 mg/dL (ref 0.50–1.35)
GFR calc non Af Amer: 59 mL/min — ABNORMAL LOW (ref >90)
GFR, EST AFRICAN AMERICAN: 68 mL/min — AB (ref >90)
Glucose, Bld: 134 mg/dL — ABNORMAL HIGH (ref 70–99)
Potassium: 3.6 mEq/L — ABNORMAL LOW (ref 3.7–5.3)
Sodium: 140 mEq/L (ref 137–147)

## 2013-09-04 LAB — GLUCOSE, CAPILLARY
GLUCOSE-CAPILLARY: 125 mg/dL — AB (ref 70–99)
GLUCOSE-CAPILLARY: 113 mg/dL — AB (ref 70–99)
Glucose-Capillary: 123 mg/dL — ABNORMAL HIGH (ref 70–99)
Glucose-Capillary: 120 mg/dL — ABNORMAL HIGH (ref 70–99)

## 2013-09-04 MED ORDER — CEFTRIAXONE SODIUM 2 G IJ SOLR
2.0000 g | INTRAMUSCULAR | Status: DC
  Administered 2013-09-04 – 2013-09-07 (×4): 2 g via INTRAVENOUS
  Filled 2013-09-04 (×4): qty 2

## 2013-09-04 NOTE — Progress Notes (Signed)
Patient has home CPAP in room and will place on self. RT will continue to monitor.

## 2013-09-04 NOTE — Progress Notes (Signed)
[facility] PROGRESS NOTE  Victor Morgan WUJ:[id number] DOB: December 16, 1932 DOA: 09/01/2013 PCP: Pamelia Hoit, MD  Interim summary: Patient is a 80 year old white male past medical history of CAD, diabetes mellitus and COPD who presented to the emergency room on 2/21 evening with increased swelling, erythematous and tenderness of his left lower extremity. He previously had surgery one week prior for removal of an ingrown toenail. He started having a temperature of 103.9 to came into the emergency room where he was found to have signs consistent with sepsis with elevated lactic acid level, white blood cell count. Patient was started on IV Zosyn and vancomycin. Over the next several days, white blood cell count slowly improved, lactic acid levels started to decline. Patient's blood cultures grew out positive for group B strep and antibiotics were narrowed to IV Rocephin 2 g daily. Patient was transferred to the floor on 2/23. He spiked a fever on the night of 2/23, but white count continues to improve.  Assessment/Plan: Active Problems:   Hyperlipidemia: Continue statin    Sleep apnea, obstructive- on C Pap at home.: Continue home CPAP machine here    Type II or unspecified type diabetes mellitus with unspecified complication, uncontrolled: Checking A1c, sugars in the interim well-controlled on sliding scale    Sepsis: Labs early morning slightly worse. However, he clinically looks better. Repeat lactic acid and lab work. WBC cont to improve.  Transfer to floor.  Blood cultures + for Grp B strep , sensitivities, change antibiotics today to IV Rocephin 2 g daily. Noted fever overnight, unsure why. White count continues to improve. Once white count normalized and no further fevers, can change to by mouth and could potentially discharged home    Gout: Stable   HTN (hypertension): Stable. Continue beta blocker, holding diuretic for now    Diastolic CHF, chronic: By echocardiogram done last  year patient has grade 2 diastolic dysfunction. BNP mildly elevated, monitoring I's & O's-weight weight only minimally elevated  Cellulitis left foot: See above Code Status: Full code  Family Communication: Left message with wife 2/23  Disposition Plan: Transfer to floor   Consultants:  None  Procedures:  None  Antibiotics:  IV vancomycin and Zosyn 2/21-present  HPI/Subjective: Patient continues to feel better. Decreased pain and swelling and erythema of left leg  Objective: Filed Vitals:   09/04/13 1420  BP: 139/56  Pulse: 69  Temp: 97.7 F (36.5 C)  Resp: 16    Intake/Output Summary (Last 24 hours) at 09/04/13 1711 Last data filed at 09/04/13 1410  Gross per 24 hour  Intake    890 ml  Output   2550 ml  Net  -1660 ml   Filed Weights   09/02/13 0149 09/03/13 0500 09/04/13 0534  Weight: 104 kg (229 lb 4.5 oz) 103 kg (227 lb 1.2 oz) 102.6 kg (226 lb 3.1 oz)    Exam:   General:  Alert and oriented x3, no acute distress  Cardiovascular: Regular rate and rhythm, S1-S2  Respiratory: Clear to auscultation bilaterally  Abdomen: Soft, nontender, nondistended, positive bowel sounds  Musculoskeletal: Trace pitting edema bilaterally, left leg continues to show signs of improvement with significantly decreased erythema and tenderness. Still 1+ pitting edema  Data Reviewed: Basic Metabolic Panel:  Recent Labs Lab 09/01/13 1750 09/02/13 0315 09/02/13 1154 09/03/13 0343 09/04/13 0531  NA 136* 138 141 143 140  K 3.7 3.5* 3.7 3.8 3.6*  CL 95* 101 102 103 99  CO2 26 22 25 27 29   GLUCOSE  159* 136* 137* 133* 134*  BUN 20 19 20 18 14   CREATININE 1.01 1.11 1.24 1.25 1.15  CALCIUM 9.5 7.9* 8.3* 8.3* 8.6   CBC:  Recent Labs Lab 09/01/13 1750 09/02/13 0315 09/02/13 1154 09/03/13 0343 09/04/13 0531  WBC 16.8* 19.5* 17.3* 15.3* 12.6*  HGB 15.0 13.1 13.4 13.3 13.0  HCT 43.9 38.8* 39.5 39.7 38.0*  MCV 91.6 91.9 91.9 92.8 90.9  PLT 149* 127* 120* 124* 117*    Cardiac Enzymes: No results found for this basename: CKTOTAL, CKMB, CKMBINDEX, TROPONINI,  in the last 168 hours BNP (last 3 results)  Recent Labs  09/02/13 1154  PROBNP 2256.0*   CBG:  Recent Labs Lab 09/03/13 1235 09/03/13 1654 09/03/13 2147 09/04/13 0759 09/04/13 1230  GLUCAP 124* 112* 120* 123* 125*    Recent Results (from the past 240 hour(s))  CULTURE, BLOOD (ROUTINE X 2)     Status: None   Collection Time    09/01/13  8:29 PM      Result Value Ref Range Status   Specimen Description BLOOD RIGHT FOREARM   Final   Special Requests BOTTLES DRAWN AEROBIC AND ANAEROBIC 10CC EACH   Final   Culture  Setup Time     Final   Value: 09/02/2013 02:18     [facility]   Culture     Final   Value: GROUP B STREP(S.AGALACTIAE)ISOLATED     Note: Gram Stain Report Called to,Read Back By and Verified With: ANGELA BARLOW 09/02/13 @ 2:04PM BY RUSCOE A.     [facility]   Report Status 09/04/2013 FINAL   Final   Organism ID, Bacteria GROUP B STREP(S.AGALACTIAE)ISOLATED   Final  CULTURE, BLOOD (ROUTINE X 2)     Status: None   Collection Time    09/01/13  8:34 PM      Result Value Ref Range Status   Specimen Description BLOOD LEFT ARM   Final   Special Requests     Final   Value: BOTTLES DRAWN AEROBIC AND ANAEROBIC 10CC BLUE,5CC RED   Culture  Setup Time     Final   Value: 09/02/2013 02:18     [facility]   Culture     Final   Value: GROUP B STREP(S.AGALACTIAE)ISOLATED     Note: SUSCEPTIBILITIES PERFORMED ON PREVIOUS CULTURE WITHIN THE LAST 5 DAYS.     Note: Gram Stain Report Called to,Read Back By and Verified With: ANGELA BARLOW 09/02/13 @ 2:04PM BY RUSCOE A.     [facility]   Report Status 09/04/2013 FINAL   Final  URINE CULTURE     Status: None   Collection Time    09/01/13  8:48 PM      Result Value Ref Range Status   Specimen Description URINE, CLEAN CATCH   Final   Special Requests  NONE   Final   Culture  Setup Time     Final   Value: 09/02/2013 02:54     [facility]   Colony Count     Final   Value: 30,000 COLONIES/ML     [facility]   Culture     Final   Value: GROUP B STREP(S.AGALACTIAE)ISOLATED     Note: TESTING AGAINST S. AGALACTIAE NOT ROUTINELY PERFORMED DUE TO PREDICTABILITY OF AMP/PEN/VAN SUSCEPTIBILITY.     [facility]   Report Status 09/03/2013 FINAL   Final  MRSA PCR SCREENING     Status: None   Collection Time    09/02/13  1:46 AM      Result Value Ref Range Status   MRSA by PCR NEGATIVE  NEGATIVE Final   Comment:            The GeneXpert MRSA Assay (FDA     approved for NASAL specimens     only), is one component of a     comprehensive MRSA colonization     surveillance program. It is not     intended to diagnose MRSA     infection nor to guide or     monitor treatment for     MRSA infections.     Studies: No results found.  Scheduled Meds: . allopurinol  300 mg Oral Daily  . aspirin EC  81 mg Oral BID  . atorvastatin  80 mg Oral q1800  . cefTRIAXone (ROCEPHIN)  IV  2 g Intravenous Q24H  . cholecalciferol  1,000 Units Oral Daily  . docusate sodium  100 mg Oral BID  . enoxaparin (LOVENOX) injection  40 mg Subcutaneous Q24H  . fluticasone  2 spray Each Nare Daily  . furosemide  20 mg Oral BID  . insulin aspart  0-15 Units Subcutaneous TID WC  . insulin aspart  0-5 Units Subcutaneous QHS  . insulin aspart  4 Units Subcutaneous TID WC  . loratadine  10 mg Oral Daily  . metoprolol  75 mg Oral BID  . omega-3 acid ethyl esters  1 g Oral Daily  . pantoprazole  40 mg Oral Daily  . prednisoLONE acetate  1 drop Left Eye Daily  . sodium chloride  3 mL Intravenous Q12H  . ascorbic acid  1,000 mg Oral Daily   Continuous Infusions: . sodium chloride Stopped (09/03/13 1200)    Active Problems:   Hyperlipidemia   Sleep apnea, obstructive- on C Pap at home.   Type II or  unspecified type diabetes mellitus with unspecified complication, uncontrolled   Sepsis   Gout   HTN (hypertension)   Diastolic CHF, chronic    Time spent: 25 minutes    Hollice Espy  [facility] Pager [phone]. If 7PM-7AM, please contact night-coverage at [email or web address], password [facility] 09/04/2013, 5:11 PM  LOS: 3 days

## 2013-09-05 DIAGNOSIS — M7989 Other specified soft tissue disorders: Secondary | ICD-10-CM

## 2013-09-05 LAB — BASIC METABOLIC PANEL WITH GFR
BUN: 17 mg/dL (ref 6–23)
CO2: 27 meq/L (ref 19–32)
Calcium: 8.4 mg/dL (ref 8.4–10.5)
Chloride: 101 meq/L (ref 96–112)
Creatinine, Ser: 1.09 mg/dL (ref 0.50–1.35)
GFR calc Af Amer: 73 mL/min — ABNORMAL LOW
GFR calc non Af Amer: 63 mL/min — ABNORMAL LOW
Glucose, Bld: 153 mg/dL — ABNORMAL HIGH (ref 70–99)
Potassium: 2.9 meq/L — CL (ref 3.7–5.3)
Sodium: 142 meq/L (ref 137–147)

## 2013-09-05 LAB — GLUCOSE, CAPILLARY
GLUCOSE-CAPILLARY: 117 mg/dL — AB (ref 70–99)
Glucose-Capillary: 122 mg/dL — ABNORMAL HIGH (ref 70–99)
Glucose-Capillary: 118 mg/dL — ABNORMAL HIGH (ref 70–99)
Glucose-Capillary: 149 mg/dL — ABNORMAL HIGH (ref 70–99)

## 2013-09-05 LAB — CBC
HCT: 35.3 % — ABNORMAL LOW (ref 39.0–52.0)
Hemoglobin: 11.9 g/dL — ABNORMAL LOW (ref 13.0–17.0)
MCH: 30.6 pg (ref 26.0–34.0)
MCHC: 33.7 g/dL (ref 30.0–36.0)
MCV: 90.7 fL (ref 78.0–100.0)
Platelets: 138 K/uL — ABNORMAL LOW (ref 150–400)
RBC: 3.89 MIL/uL — ABNORMAL LOW (ref 4.22–5.81)
RDW: 14.5 % (ref 11.5–15.5)
WBC: 9.7 K/uL (ref 4.0–10.5)

## 2013-09-05 LAB — POTASSIUM: Potassium: 3.9 meq/L (ref 3.7–5.3)

## 2013-09-05 LAB — HEMOGLOBIN A1C
HEMOGLOBIN A1C: 7.1 % — AB (ref <5.7)
Mean Plasma Glucose: 157 mg/dL — ABNORMAL HIGH (ref <117)

## 2013-09-05 MED ORDER — POTASSIUM CHLORIDE CRYS ER 20 MEQ PO TBCR
40.0000 meq | EXTENDED_RELEASE_TABLET | Freq: Once | ORAL | Status: AC
  Administered 2013-09-05: 40 meq via ORAL
  Filled 2013-09-05: qty 2

## 2013-09-05 MED ORDER — POTASSIUM CHLORIDE 20 MEQ/15ML (10%) PO LIQD
40.0000 meq | Freq: Once | ORAL | Status: AC
  Administered 2013-09-05: 40 meq via ORAL
  Filled 2013-09-05: qty 30

## 2013-09-05 MED ORDER — MAGNESIUM SULFATE IN D5W 10-5 MG/ML-% IV SOLN
1.0000 g | Freq: Once | INTRAVENOUS | Status: AC
  Administered 2013-09-05: 1 g via INTRAVENOUS
  Filled 2013-09-05 (×2): qty 100

## 2013-09-05 MED ORDER — FUROSEMIDE 20 MG PO TABS
20.0000 mg | ORAL_TABLET | Freq: Every day | ORAL | Status: DC
  Filled 2013-09-05: qty 1

## 2013-09-05 NOTE — Progress Notes (Signed)
*  PRELIMINARY RESULTS* Vascular Ultrasound Left lower extremity venous duplex has been completed.  Preliminary findings: negative for DVT.   Farrel Demark, RDMS, RVT  09/05/2013, 11:19 AM

## 2013-09-05 NOTE — Research (Addendum)
Protocol Title: Medically Ill Patient Assessment of Rivaroxaban Versus Placebo IN Reducing Post-Discharge Venus Thrombo- Embolism Risk  This patient,Victor Morgan, consented to the above clinical trial according to FDA regulations, GCP guidelines and [facility] SOPs. The study design has been explained to the patient and the patient demonstrated comprehension. No study procedures have been initiated before consenting of this patient. This patient was given sufficient time for reading the consent and asking questions. All risks, benefits and options have been thoroughly discussed. This patient was not coerced in any way to participate or to continue participation in this clinical trial. The patient has signed voluntarily on September 05, 2013. This patient was given a copy of this consent.    Kinnie Feil, RN

## 2013-09-05 NOTE — Progress Notes (Signed)
CRITICAL VALUE ALERT  Critical value received:  Potassium 2.9  Date of notification:  09/05/2013  Time of notification:  0506  Critical value read back:yes  Nurse who received alert:  Jacklyn Shell RN  MD notified (1st page):  Tama Gander NP  Time of first page:  0510  MD notified (2nd page):  Time of second page:  Responding MD:  Tama Gander NP  Time MD responded:  [phone]

## 2013-09-05 NOTE — Progress Notes (Signed)
[facility] PROGRESS NOTE  Victor Morgan NGE:[id number] DOB: 05-26-1933 DOA: 09/01/2013 PCP: Pamelia Hoit, MD    Interim summary:   Patient is a 80 year old white male past medical history of CAD, diabetes mellitus and COPD who presented to the emergency room on 2/21 evening with increased swelling, erythematous and tenderness of his left lower extremity. He previously had surgery one week prior for removal of an ingrown toenail. He started having a temperature of 103.9 to came into the emergency room where he was found to have signs consistent with sepsis with elevated lactic acid level, white blood cell count. Patient was started on IV Zosyn and vancomycin. Over the next several days, white blood cell count slowly improved, lactic acid levels started to decline. Patient's blood cultures grew out positive for group B strep and antibiotics were narrowed to IV Rocephin 2 g daily. Patient was transferred to the floor on 2/23. He spiked a fever on the night of 2/23, but white count continues to improve.    Assessment/Plan:    Sepsis and  Group B strep Bacteremia due to left lower extremity cellulitis: Discussed with ID physician Dr. Orvan Falconer, appropriate on Rocephin, we'll switch to oral penicillin prior to discharge total of 14 days. He has responded well to appropriate antibiotic treatment.      Hyperlipidemia: Continue statin     Sleep apnea, obstructive- on C Pap at home.: Continue home CPAP machine here     Type II or unspecified type diabetes mellitus with unspecified complication, uncontrolled:  sugars in the interim well-controlled on sliding scale  Lab Results  Component Value Date   HGBA1C 7.1* 09/04/2013    CBG (last 3)   Recent Labs  09/04/13 2145 09/05/13 0805 09/05/13 1215  GLUCAP 120* 122* 118*      Gout: Stable     HTN (hypertension): Stable. Continue beta blocker, holding diuretic for now     Diastolic CHF, chronic: By  echocardiogram done last year patient has grade 2 diastolic dysfunction. BNP mildly elevated, monitoring I's & O's-weight, on oral Lasix      Low potassium. Replaced, recheck in the morning with magnesium       Code Status: Full code  Family Communication: Left message with wife 2/23  Disposition Plan: Transfer to floor      Consultants:  None    Procedures:  Left leg venous duplex    Antibiotics:  IV vancomycin and Zosyn 2/21-present    HPI/Subjective: Patient continues to feel better. Decreased pain and swelling and erythema of left leg  Objective: Filed Vitals:   09/05/13 0533  BP: 130/60  Pulse: 64  Temp: 98.5 F (36.9 C)  Resp: 16    Intake/Output Summary (Last 24 hours) at 09/05/13 1306 Last data filed at 09/05/13 1216  Gross per 24 hour  Intake    840 ml  Output   3775 ml  Net  -2935 ml   Filed Weights   09/03/13 0500 09/04/13 0534 09/05/13 0533  Weight: 103 kg (227 lb 1.2 oz) 102.6 kg (226 lb 3.1 oz) 102.7 kg (226 lb 6.6 oz)    Exam:   General:  Alert and oriented x3, no acute distress  Cardiovascular: Regular rate and rhythm, S1-S2  Respiratory: Clear to auscultation bilaterally  Abdomen: Soft, nontender, nondistended, positive bowel sounds  Musculoskeletal: L Leg Still 2+ pitting edema and erythema   Data Reviewed: Basic Metabolic Panel:  Recent Labs Lab 09/02/13 0315 09/02/13 1154 09/03/13 0343 09/04/13 0531 09/05/13 4010  NA 138 141 143 140 142  K 3.5* 3.7 3.8 3.6* 2.9*  CL 101 102 103 99 101  CO2 22 25 27 29 27   GLUCOSE 136* 137* 133* 134* 153*  BUN 19 20 18 14 17   CREATININE 1.11 1.24 1.25 1.15 1.09  CALCIUM 7.9* 8.3* 8.3* 8.6 8.4   CBC:  Recent Labs Lab 09/02/13 0315 09/02/13 1154 09/03/13 0343 09/04/13 0531 09/05/13 0339  WBC 19.5* 17.3* 15.3* 12.6* 9.7  HGB 13.1 13.4 13.3 13.0 11.9*  HCT 38.8* 39.5 39.7 38.0* 35.3*  MCV 91.9 91.9 92.8 90.9 90.7  PLT 127* 120* 124* 117* 138*   Cardiac  Enzymes: No results found for this basename: CKTOTAL, CKMB, CKMBINDEX, TROPONINI,  in the last 168 hours BNP (last 3 results)  Recent Labs  09/02/13 1154  PROBNP 2256.0*   CBG:  Recent Labs Lab 09/04/13 1230 09/04/13 1658 09/04/13 2145 09/05/13 0805 09/05/13 1215  GLUCAP 125* 113* 120* 122* 118*    Recent Results (from the past 240 hour(s))  CULTURE, BLOOD (ROUTINE X 2)     Status: None   Collection Time    09/01/13  8:29 PM      Result Value Ref Range Status   Specimen Description BLOOD RIGHT FOREARM   Final   Special Requests BOTTLES DRAWN AEROBIC AND ANAEROBIC 10CC EACH   Final   Culture  Setup Time     Final   Value: 09/02/2013 02:18     [facility]formed at [facility]   Culture     Final   Value: GROUP B STREP(S.AGALACTIAE)ISOLATED     Note: Gram Stain Report Called to,Read Back By and Verified With: ANGELA BARLOW 09/02/13 @ 2:04PM BY RUSCOE A.     Performed at [facility]   Report Status 09/04/2013 FINAL   Final   Organism ID, Bacteria GROUP B STREP(S.AGALACTIAE)ISOLATED   Final  CULTURE, BLOOD (ROUTINE X 2)     Status: None   Collection Time    09/01/13  8:34 PM      Result Value Ref Range Status   Specimen Description BLOOD LEFT ARM   Final   Special Requests     Final   Value: BOTTLES DRAWN AEROBIC AND ANAEROBIC 10CC BLUE,5CC RED   Culture  Setup Time     Final   Value: 09/02/2013 02:18     Performed at [facility]   Culture     Final   Value: GROUP B STREP(S.AGALACTIAE)ISOLATED     Note: SUSCEPTIBILITIES PERFORMED ON PREVIOUS CULTURE WITHIN THE LAST 5 DAYS.     Note: Gram Stain Report Called to,Read Back By and Verified With: ANGELA BARLOW 09/02/13 @ 2:04PM BY RUSCOE A.     Performed at [facility]   Report Status 09/04/2013 FINAL   Final  URINE CULTURE     Status: None   Collection Time    09/01/13  8:48 PM      Result Value Ref Range Status   Specimen Description URINE, CLEAN CATCH   Final   Special Requests NONE   Final    Culture  Setup Time     Final   Value: 09/02/2013 02:54     [facility]formed at [facility]   Colony Count     Final   Value: 30,000 COLONIES/ML     Performed at [facility]   Culture     Final   Value: GROUP B STREP(S.AGALACTIAE)ISOLATED     Note: TESTING AGAINST S.  AGALACTIAE NOT ROUTINELY PERFORMED DUE TO PREDICTABILITY OF AMP/PEN/VAN SUSCEPTIBILITY.     Performed at [facility]   Report Status 09/03/2013 FINAL   Final  MRSA PCR SCREENING     Status: None   Collection Time    09/02/13  1:46 AM      Result Value Ref Range Status   MRSA by PCR NEGATIVE  NEGATIVE Final   Comment:            The GeneXpert MRSA Assay (FDA     approved for NASAL specimens     only), is one component of a     comprehensive MRSA colonization     surveillance program. It is not     intended to diagnose MRSA     infection nor to guide or     monitor treatment for     MRSA infections.     Studies: No results found.  Scheduled Meds: . allopurinol  300 mg Oral Daily  . aspirin EC  81 mg Oral BID  . atorvastatin  80 mg Oral q1800  . cefTRIAXone (ROCEPHIN)  IV  2 g Intravenous Q24H  . cholecalciferol  1,000 Units Oral Daily  . docusate sodium  100 mg Oral BID  . enoxaparin (LOVENOX) injection  40 mg Subcutaneous Q24H  . fluticasone  2 spray Each Nare Daily  . furosemide  20 mg Oral BID  . insulin aspart  0-15 Units Subcutaneous TID WC  . insulin aspart  0-5 Units Subcutaneous QHS  . insulin aspart  4 Units Subcutaneous TID WC  . loratadine  10 mg Oral Daily  . metoprolol  75 mg Oral BID  . omega-3 acid ethyl esters  1 g Oral Daily  . pantoprazole  40 mg Oral Daily  . prednisoLONE acetate  1 drop Left Eye Daily  . sodium chloride  3 mL Intravenous Q12H  . ascorbic acid  1,000 mg Oral Daily   Continuous Infusions: . sodium chloride Stopped (09/03/13 1200)    Active Problems:   Hyperlipidemia   Sleep apnea, obstructive- on C Pap at home.   Type II or unspecified type  diabetes mellitus with unspecified complication, uncontrolled   Sepsis   Gout   HTN (hypertension)   Diastolic CHF, chronic    Time spent: 25 minutes    [facility]  [facility] Pager [phone]. If 7PM-7AM, please contact night-coverage at [email or web address], password [facility] 09/05/2013, 1:06 PM  LOS: 4 days

## 2013-09-05 NOTE — Progress Notes (Signed)
Patient has home CPAP unit in his room.  Patient is able to place himself on/off.

## 2013-09-06 LAB — GLUCOSE, CAPILLARY
GLUCOSE-CAPILLARY: 133 mg/dL — AB (ref 70–99)
GLUCOSE-CAPILLARY: 189 mg/dL — AB (ref 70–99)
GLUCOSE-CAPILLARY: 122 mg/dL — AB (ref 70–99)
Glucose-Capillary: 98 mg/dL (ref 70–99)

## 2013-09-06 LAB — MAGNESIUM: MAGNESIUM: 1.9 mg/dL (ref 1.5–2.5)

## 2013-09-06 MED ORDER — FUROSEMIDE 40 MG PO TABS
40.0000 mg | ORAL_TABLET | Freq: Every day | ORAL | Status: DC
  Administered 2013-09-06 – 2013-09-07 (×2): 40 mg via ORAL
  Filled 2013-09-06: qty 1

## 2013-09-06 MED ORDER — POTASSIUM CHLORIDE CRYS ER 20 MEQ PO TBCR
40.0000 meq | EXTENDED_RELEASE_TABLET | Freq: Once | ORAL | Status: AC
  Administered 2013-09-06: 40 meq via ORAL
  Filled 2013-09-06: qty 2

## 2013-09-06 NOTE — Progress Notes (Addendum)
[facility] PROGRESS NOTE  Victor Morgan DJT:[id number] DOB: [birth year] DOA: 09/01/2013 PCP: Pamelia Hoit, MD    Interim summary:  Patient is a 80 year old white male past medical history of CAD, diabetes mellitus and COPD who presented to the emergency room on 2/21 evening with increased swelling, erythematous and tenderness of his left lower extremity. He previously had surgery one week prior for removal of an ingrown toenail. He started having a temperature of 103.9 to came into the emergency room where he was found to have signs consistent with sepsis with elevated lactic acid level, white blood cell count. Patient was started on IV Zosyn and vancomycin. Over the next several days, white blood cell count slowly improved, lactic acid levels started to decline. Patient's blood cultures grew out positive for group B strep and antibiotics were narrowed to IV Rocephin 2 g daily. Patient was transferred to the floor on 2/23. He spiked a fever on the night of 2/23, but white count continues to improve.    Assessment/Plan:   Sepsis and  Group B strep Bacteremia due to left lower extremity cellulitis: Discussed with ID physician Dr. Orvan Falconer, appropriate on Rocephin, we'll switch to oral penicillin prior to discharge total of 14 days. He has responded well to appropriate antibiotic treatment. He still has considerable swelling in the left leg, [place] stable, UNA boot.      Hyperlipidemia: Continue statin      Sleep apnea, obstructive- on C Pap at home.: Continue home CPAP machine here     Type II or unspecified type diabetes mellitus with unspecified complication, uncontrolled:  sugars in the interim well-controlled on sliding scale   Lab Results  Component Value Date   HGBA1C 7.1* 09/04/2013    CBG (last 3)   Recent Labs  09/05/13 1630 09/05/13 2258 09/06/13 0833  GLUCAP 149* 117* 133*      Gout: Stable      HTN (hypertension): Stable. Continue beta  blocker       Diastolic CHF, chronic: By echocardiogram done last year patient has grade 2 diastolic dysfunction. BNP mildly elevated, monitoring I's & O's-weight, on oral Lasix      Low potassium. Replaced, stable        Code Status: Full code  Family Communication: Left message with wife 2/23  Disposition Plan: Transfer to floor      Consultants:  None    Procedures:  Left leg venous duplex      HPI/Subjective:  Patient continues to feel better. Decreased pain and swelling and erythema of left leg   Objective: Filed Vitals:   09/06/13 0526  BP: 145/63  Pulse: 61  Temp: 97.5 F (36.4 C)  Resp: 16    Intake/Output Summary (Last 24 hours) at 09/06/13 0954 Last data filed at 09/06/13 3009  Gross per 24 hour  Intake    240 ml  Output   2350 ml  Net  -2110 ml   Filed Weights   09/04/13 0534 09/05/13 0533 09/06/13 0526  Weight: 102.6 kg (226 lb 3.1 oz) 102.7 kg (226 lb 6.6 oz) 90.1 kg (198 lb 10.2 oz)    Exam:   General:  Alert and oriented x3, no acute distress  Cardiovascular: Regular rate and rhythm, S1-S2  Respiratory: Clear to auscultation bilaterally  Abdomen: Soft, nontender, nondistended, positive bowel sounds  Musculoskeletal: L Leg Still 2+ pitting edema and erythema   Data Reviewed: Basic Metabolic Panel:  Recent Labs Lab 09/02/13 0315 09/02/13 1154 09/03/13 0343 09/04/13 0531 09/05/13  1610 09/05/13 1819  NA 138 141 143 140 142  --   K 3.5* 3.7 3.8 3.6* 2.9* 3.9  CL 101 102 103 99 101  --   CO2 22 25 27 29 27   --   GLUCOSE 136* 137* 133* 134* 153*  --   BUN 19 20 18 14 17   --   CREATININE 1.11 1.24 1.25 1.15 1.09  --   CALCIUM 7.9* 8.3* 8.3* 8.6 8.4  --    CBC:  Recent Labs Lab 09/02/13 0315 09/02/13 1154 09/03/13 0343 09/04/13 0531 09/05/13 0339  WBC 19.5* 17.3* 15.3* 12.6* 9.7  HGB 13.1 13.4 13.3 13.0 11.9*  HCT 38.8* 39.5 39.7 38.0* 35.3*  MCV 91.9 91.9 92.8 90.9 90.7  PLT 127* 120* 124* 117*  138*   Cardiac Enzymes: No results found for this basename: CKTOTAL, CKMB, CKMBINDEX, TROPONINI,  in the last 168 hours BNP (last 3 results)  Recent Labs  09/02/13 1154  PROBNP 2256.0*   CBG:  Recent Labs Lab 09/05/13 0805 09/05/13 1215 09/05/13 1630 09/05/13 2258 09/06/13 0833  GLUCAP 122* 118* 149* 117* 133*    Recent Results (from the past 240 hour(s))  CULTURE, BLOOD (ROUTINE X 2)     Status: None   Collection Time    09/01/13  8:29 PM      Result Value Ref Range Status   Specimen Description BLOOD RIGHT FOREARM   Final   Special Requests BOTTLES DRAWN AEROBIC AND ANAEROBIC 10CC EACH   Final   Culture  Setup Time     Final   Value: 09/02/2013 02:18     Performed at [facility]   Culture     Final   Value: GROUP B STREP(S.AGALACTIAE)ISOLATED     Note: Gram Stain Report Called to,Read Back By and Verified With: ANGELA BARLOW 09/02/13 @ 2:04PM BY RUSCOE A.     Performed at [facility]   Report Status 09/04/2013 FINAL   Final   Organism ID, Bacteria GROUP B STREP(S.AGALACTIAE)ISOLATED   Final  CULTURE, BLOOD (ROUTINE X 2)     Status: None   Collection Time    09/01/13  8:34 PM      Result Value Ref Range Status   Specimen Description BLOOD LEFT ARM   Final   Special Requests     Final   Value: BOTTLES DRAWN AEROBIC AND ANAEROBIC 10CC BLUE,5CC RED   Culture  Setup Time     Final   Value: 09/02/2013 02:18     Performed at [facility]   Culture     Final   Value: GROUP B STREP(S.AGALACTIAE)ISOLATED     Note: SUSCEPTIBILITIES PERFORMED ON PREVIOUS CULTURE WITHIN THE LAST 5 DAYS.     Note: Gram Stain Report Called to,Read Back By and Verified With: ANGELA BARLOW 09/02/13 @ 2:04PM BY RUSCOE A.     Performed at [facility]   Report Status 09/04/2013 FINAL   Final  URINE CULTURE     Status: None   Collection Time    09/01/13  8:48 PM      Result Value Ref Range Status   Specimen Description URINE, CLEAN CATCH   Final   Special  Requests NONE   Final   Culture  Setup Time     Final   Value: 09/02/2013 02:54     Performed at [facility] Count     Final   Value: 30,000 COLONIES/ML     Performed  at [facility]   Culture     Final   Value: GROUP B STREP(S.AGALACTIAE)ISOLATED     Note: TESTING AGAINST S. AGALACTIAE NOT ROUTINELY PERFORMED DUE TO PREDICTABILITY OF AMP/PEN/VAN SUSCEPTIBILITY.     Performed at [facility]   Report Status 09/03/2013 FINAL   Final  MRSA PCR SCREENING     Status: None   Collection Time    09/02/13  1:46 AM      Result Value Ref Range Status   MRSA by PCR NEGATIVE  NEGATIVE Final   Comment:            The GeneXpert MRSA Assay (FDA     approved for NASAL specimens     only), is one component of a     comprehensive MRSA colonization     surveillance program. It is not     intended to diagnose MRSA     infection nor to guide or     monitor treatment for     MRSA infections.     Studies: No results found.  Scheduled Meds: . allopurinol  300 mg Oral Daily  . aspirin EC  81 mg Oral BID  . atorvastatin  80 mg Oral q1800  . cefTRIAXone (ROCEPHIN)  IV  2 g Intravenous Q24H  . cholecalciferol  1,000 Units Oral Daily  . docusate sodium  100 mg Oral BID  . enoxaparin (LOVENOX) injection  40 mg Subcutaneous Q24H  . fluticasone  2 spray Each Nare Daily  . furosemide  20 mg Oral Daily  . insulin aspart  0-15 Units Subcutaneous TID WC  . insulin aspart  0-5 Units Subcutaneous QHS  . insulin aspart  4 Units Subcutaneous TID WC  . loratadine  10 mg Oral Daily  . metoprolol  75 mg Oral BID  . omega-3 acid ethyl esters  1 g Oral Daily  . pantoprazole  40 mg Oral Daily  . prednisoLONE acetate  1 drop Left Eye Daily  . sodium chloride  3 mL Intravenous Q12H  . ascorbic acid  1,000 mg Oral Daily   Continuous Infusions: . sodium chloride Stopped (09/03/13 1200)    Anti-infectives   Start     Dose/Rate Route Frequency Ordered Stop   09/04/13 0815   cefTRIAXone (ROCEPHIN) 2 g in dextrose 5 % 50 mL IVPB     2 g 100 mL/hr over 30 Minutes Intravenous Every 24 hours 09/04/13 0804     09/02/13 1000  vancomycin (VANCOCIN) 1,250 mg in sodium chloride 0.9 % 250 mL IVPB  Status:  Discontinued     1,250 mg 166.7 mL/hr over 90 Minutes Intravenous Every 12 hours 09/01/13 2246 09/04/13 0804   09/02/13 0400  piperacillin-tazobactam (ZOSYN) IVPB 3.375 g  Status:  Discontinued     3.375 g 12.5 mL/hr over 240 Minutes Intravenous 3 times per day 09/01/13 2246 09/04/13 0804   09/01/13 2230  piperacillin-tazobactam (ZOSYN) IVPB 3.375 g     3.375 g 100 mL/hr over 30 Minutes Intravenous  Once 09/01/13 2226 09/01/13 2300   09/01/13 2230  vancomycin (VANCOCIN) IVPB 1000 mg/200 mL premix     1,000 mg 200 mL/hr over 60 Minutes Intravenous  Once 09/01/13 2226 09/01/13 2343       Time spent: 25 minutes    [facility]  [facility] Pager [phone]. If 7PM-7AM, please contact night-coverage at [email or web address], password [email or web address] 09/06/2013, 9:54 AM  LOS: 5 days

## 2013-09-07 LAB — CBC WITH DIFFERENTIAL/PLATELET
BASOS PCT: 0 % (ref 0–1)
Basophils Absolute: 0 10*3/uL (ref 0.0–0.1)
Eosinophils Absolute: 0.3 10*3/uL (ref 0.0–0.7)
Eosinophils Relative: 4 % (ref 0–5)
HCT: 41.2 % (ref 39.0–52.0)
HEMOGLOBIN: 13.7 g/dL (ref 13.0–17.0)
Lymphocytes Relative: 25 % (ref 12–46)
Lymphs Abs: 2.3 10*3/uL (ref 0.7–4.0)
MCH: 30.7 pg (ref 26.0–34.0)
MCHC: 33.3 g/dL (ref 30.0–36.0)
MCV: 92.4 fL (ref 78.0–100.0)
MONO ABS: 0.7 10*3/uL (ref 0.1–1.0)
MONOS PCT: 8 % (ref 3–12)
Neutro Abs: 6 10*3/uL (ref 1.7–7.7)
Neutrophils Relative %: 64 % (ref 43–77)
Platelets: 205 10*3/uL (ref 150–400)
RBC: 4.46 MIL/uL (ref 4.22–5.81)
RDW: 14.5 % (ref 11.5–15.5)
WBC: 9.4 10*3/uL (ref 4.0–10.5)

## 2013-09-07 LAB — CREATININE, SERUM
CREATININE: 1.05 mg/dL (ref 0.50–1.35)
GFR calc Af Amer: 76 mL/min — ABNORMAL LOW (ref >90)
GFR, EST NON AFRICAN AMERICAN: 66 mL/min — AB (ref >90)

## 2013-09-07 LAB — GLUCOSE, CAPILLARY
Glucose-Capillary: 130 mg/dL — ABNORMAL HIGH (ref 70–99)
Glucose-Capillary: 136 mg/dL — ABNORMAL HIGH (ref 70–99)

## 2013-09-07 MED ORDER — AMOXICILLIN 500 MG PO CAPS
1000.0000 mg | ORAL_CAPSULE | Freq: Three times a day (TID) | ORAL | Status: DC
  Administered 2013-09-07: 1000 mg via ORAL
  Filled 2013-09-07 (×2): qty 2

## 2013-09-07 MED ORDER — AMOXICILLIN 875 MG PO TABS: 875.0000 mg | ORAL_TABLET | Freq: Two times a day (BID) | ORAL | Status: AC

## 2013-09-07 MED ORDER — SACCHAROMYCES BOULARDII 250 MG PO CAPS: 250.0000 mg | ORAL_CAPSULE | Freq: Two times a day (BID) | ORAL | Status: AC

## 2013-09-07 NOTE — Progress Notes (Signed)
Discharge home. Home discharge instruction given, no question verbalized. 

## 2013-09-07 NOTE — Consult Note (Signed)
WOC wound consult note Reason for Consult: Consult requested for left leg; compression wrap was requested yesterday by physician.  Today, previous cellulitis to left leg has improved and pt states this plan of care is no longer desired by Dr.   Ronny Bacon type: No open wound or drainage; generalized edema and erythremia to LLE. Dressing procedure/placement/frequency: Pt is planning to discharge home today and plans to keep left leg elevated as much as possible.  No other topical care needed at this time. Please re-consult if further assistance is needed.  Thank-you,  Cammie Mcgee MSN, RN, CWOCN, [place], CNS [phone]

## 2013-09-07 NOTE — Discharge Instructions (Signed)
Follow with Primary MD Pamelia Hoit, MD in 7 days   Get CBC, CMP, checked 7 days by Primary MD and again as instructed by your Primary MD. Get a 2 view Chest X ray done next visit if you had Pneumonia of Lung problems at the Hospital.  Keep L Leg elevated if sitting in a chair   Activity: As tolerated with Full fall precautions use walker/cane & assistance as needed   Disposition Home     Diet: Heart Healthy - Low carb  Check your Weight same time everyday, if you gain over 2 pounds, or you develop in leg swelling, experience more shortness of breath or chest pain, call your Primary MD immediately. Follow Cardiac Low Salt Diet and 1.8 lit/day fluid restriction.   On your next visit with her primary care physician please Get Medicines reviewed and adjusted.  Please request your Prim.MD to go over all Hospital Tests and Procedure/Radiological results at the follow up, please get all Hospital records sent to your Prim MD by signing hospital release before you go home.   If you experience worsening of your admission symptoms, develop shortness of breath, life threatening emergency, suicidal or homicidal thoughts you must seek medical attention immediately by calling 911 or calling your MD immediately  if symptoms less severe.  You Must read complete instructions/literature along with all the possible adverse reactions/side effects for all the Medicines you take and that have been prescribed to you. Take any new Medicines after you have completely understood and accpet all the possible adverse reactions/side effects.   Do not drive and provide baby sitting services if your were admitted for syncope or siezures until you have seen by Primary MD or a Neurologist and advised to do so again.  Do not drive when taking Pain medications.    Do not take more than prescribed Pain, Sleep and Anxiety Medications  Special Instructions: If you have smoked or chewed Tobacco  in the last 2 yrs  please stop smoking, stop any regular Alcohol  and or any Recreational drug use.  Wear Seat belts while driving.   Please note  You were cared for by a hospitalist during your hospital stay. If you have any questions about your discharge medications or the care you received while you were in the hospital after you are discharged, you can call the unit and asked to speak with the hospitalist on call if the hospitalist that took care of you is not available. Once you are discharged, your primary care physician will handle any further medical issues. Please note that NO REFILLS for any discharge medications will be authorized once you are discharged, as it is imperative that you return to your primary care physician (or establish a relationship with a primary care physician if you do not have one) for your aftercare needs so that they can reassess your need for medications and monitor your lab values.

## 2013-09-07 NOTE — Discharge Summary (Signed)
Victor Morgan, is a 80 y.o. male  DOB September 21, 1932  MRN [id number].  Admission date:  09/01/2013  Admitting Physician  Edsel Petrin, DO  Discharge Date:  09/07/2013   Primary MD  Pamelia Hoit, MD  Recommendations for primary care physician for things to follow:   Monitor CBC, left leg cellulitis closely   Admission Diagnosis  Cellulitis [682.9] Sepsis [038.9, 995.91]   Discharge Diagnosis  Cellulitis [682.9] Sepsis [038.9, 995.91]   Group B strep bacteremia secondary to cellulitis   Principal Problem:   Sepsis due to Streptococcus, group B Active Problems:   Hyperlipidemia   Sleep apnea, obstructive- on C Pap at home.   Type II or unspecified type diabetes mellitus with unspecified complication, uncontrolled   Gout   HTN (hypertension)   Diastolic CHF, chronic      Past Medical History  Diagnosis Date  . Cough     chronic cough d/t sinusitis  . Diabetes mellitus     type II  . Hyperlipidemia   . Sleep apnea, obstructive     CPAP NIGHTLY  . Myocardial infarction 1997  . Hx of heart artery stent 1997    PTA  AND STENT X2 TO LAD,STENT TO CICUMFLEX  . Obesity, mild   . Hypertension     Past Surgical History  Procedure Laterality Date  . Heart bypass  nov 2012  . Cornea graft      bilateral eyes  . Cataract extraction      bilateral eyes  . Coronary artery bypass graft    . Cardiac catheterization       Discharge Condition: Stable   Follow UP  Follow-up Information   Follow up with Pamelia Hoit, MD. Schedule an appointment as soon as possible for a visit in 1 week.   Specialty:  Family Medicine   Contact information:   [address] [place] [address] [phone]         Discharge Instructions  and  Discharge Medications     Discharge Orders   Future Orders  Complete By Expires   Diet - low sodium heart healthy  As directed    Discharge instructions  As directed    Comments:     Follow with Primary MD Pamelia Hoit, MD in 7 days   Get CBC, CMP, checked 7 days by Primary MD and again as instructed by your Primary MD. Get a 2 view Chest X ray done next visit if you had Pneumonia of Lung problems at the Hospital.  Keep L Leg elevated if sitting in a chair   Activity: As tolerated with Full fall precautions use walker/cane & assistance as needed   Disposition Home     Diet: Heart Healthy - Low carb  Check your Weight same time everyday, if you gain over 2 pounds, or you develop in leg swelling, experience more shortness of breath or chest pain, call your Primary MD immediately. Follow Cardiac Low Salt Diet and 1.8 lit/day fluid restriction.   On  your next visit with her primary care physician please Get Medicines reviewed and adjusted.  Please request your Prim.MD to go over all Hospital Tests and Procedure/Radiological results at the follow up, please get all Hospital records sent to your Prim MD by signing hospital release before you go home.   If you experience worsening of your admission symptoms, develop shortness of breath, life threatening emergency, suicidal or homicidal thoughts you must seek medical attention immediately by calling 911 or calling your MD immediately  if symptoms less severe.  You Must read complete instructions/literature along with all the possible adverse reactions/side effects for all the Medicines you take and that have been prescribed to you. Take any new Medicines after you have completely understood and accpet all the possible adverse reactions/side effects.   Do not drive and provide baby sitting services if your were admitted for syncope or siezures until you have seen by Primary MD or a Neurologist and advised to do so again.  Do not drive when taking Pain medications.    Do not take more than  prescribed Pain, Sleep and Anxiety Medications  Special Instructions: If you have smoked or chewed Tobacco  in the last 2 yrs please stop smoking, stop any regular Alcohol  and or any Recreational drug use.  Wear Seat belts while driving.   Please note  You were cared for by a hospitalist during your hospital stay. If you have any questions about your discharge medications or the care you received while you were in the hospital after you are discharged, you can call the unit and asked to speak with the hospitalist on call if the hospitalist that took care of you is not available. Once you are discharged, your primary care physician will handle any further medical issues. Please note that NO REFILLS for any discharge medications will be authorized once you are discharged, as it is imperative that you return to your primary care physician (or establish a relationship with a primary care physician if you do not have one) for your aftercare needs so that they can reassess your need for medications and monitor your lab values.   Increase activity slowly  As directed        Medication List         acetaminophen 500 MG tablet  Commonly known as:  TYLENOL  Take 1,000 mg by mouth every 6 (six) hours as needed for pain.     allopurinol 300 MG tablet  Commonly known as:  ZYLOPRIM  Take 300 mg by mouth daily.     amoxicillin 875 MG tablet  Commonly known as:  AMOXIL  Take 1 tablet (875 mg total) by mouth 2 (two) times daily.     ascorbic acid 1000 MG tablet  Commonly known as:  VITAMIN C  Take 1,000 mg by mouth daily.     aspirin 81 MG tablet  Take 81 mg by mouth 2 (two) times daily.     B-complex with vitamin C tablet  Take 1 tablet by mouth daily.     cetirizine 10 MG chewable tablet  Commonly known as:  ZYRTEC  Chew 10 mg by mouth at bedtime.     COMBIVENT RESPIMAT 20-100 MCG/ACT Aers respimat  Generic drug:  Ipratropium-Albuterol  Inhale 1 puff into the lungs 4 (four) times daily  as needed for wheezing or shortness of breath.     FISH OIL + D3 1200-1000 MG-UNIT Caps  Take 1 capsule by mouth every morning.  fluticasone 50 MCG/ACT nasal spray  Commonly known as:  FLONASE  Place 2 sprays into the nose 2 (two) times daily.     hydrochlorothiazide 25 MG tablet  Commonly known as:  HYDRODIURIL  Take 25 mg by mouth daily.     hydroxypropyl methylcellulose 2.5 % ophthalmic solution  Commonly known as:  ISOPTO TEARS  Place 1 drop into both eyes daily as needed for dry eyes.     LUBRICANT EYE Oint  Place 1 application into the left eye at bedtime as needed (dry eye).     metFORMIN 1000 MG tablet  Commonly known as:  GLUCOPHAGE  Take 1,000 mg by mouth 2 (two) times daily with a meal.     metoprolol 50 MG tablet  Commonly known as:  LOPRESSOR  Take 75 mg by mouth 2 (two) times daily.     nitroGLYCERIN 0.4 MG SL tablet  Commonly known as:  NITROSTAT  Place 0.4 mg under the tongue every 5 (five) minutes as needed.     omeprazole 20 MG capsule  Commonly known as:  PRILOSEC  Take 20 mg by mouth daily.     prednisoLONE acetate 1 % ophthalmic suspension  Commonly known as:  PRED FORTE  Place 1 drop into the left eye daily.     rosuvastatin 40 MG tablet  Commonly known as:  CRESTOR  Take 20 mg by mouth daily.     saccharomyces boulardii 250 MG capsule  Commonly known as:  FLORASTOR  Take 1 capsule (250 mg total) by mouth 2 (two) times daily.     SINUS WASH NETI POT NA  Place 1 application into the nose 2 (two) times daily as needed (for washing sinuses).     triamcinolone cream 0.1 %  Commonly known as:  KENALOG  Apply 1 application topically 2 (two) times daily.     vitamin E 1000 UNIT capsule  Take 1,000 Units by mouth daily.          Diet and Activity recommendation: See Discharge Instructions above   Consults obtained - infectious disease Dr. Orvan Falconer over the phone   Major procedures and Radiology Reports - PLEASE review detailed and  final reports for all details, in brief -       Dg Chest [facility] 1 View  09/01/2013   CLINICAL DATA:  Dizziness, fever, history coronary artery disease post MI, stenting, and CABG, hypertension, former smoker, diabetes  EXAM: PORTABLE CHEST - 1 VIEW  COMPARISON:  Portable exam 2036 hr compared to 11/11/2012  FINDINGS: Enlargement of cardiac silhouette post CABG.  Mediastinal contours and pulmonary vascularity normal.  Right basilar atelectasis.  Lungs otherwise clear.  No pleural effusion or pneumothorax.  Sclerotic focus at proximal right humerus, noted on 04/03/2012 as well.  IMPRESSION: Enlargement of cardiac silhouette post CABG.  Right basilar atelectasis.   Electronically Signed   By: Ulyses Southward M.D.   On: 09/01/2013 20:44    Micro Results      Recent Results (from the past 240 hour(s))  CULTURE, BLOOD (ROUTINE X 2)     Status: None   Collection Time    09/01/13  8:29 PM      Result Value Ref Range Status   Specimen Description BLOOD RIGHT FOREARM   Final   Special Requests BOTTLES DRAWN AEROBIC AND ANAEROBIC 10CC EACH   Final   Culture  Setup Time     Final   Value: 09/02/2013 02:18     Performed at [facility]   Culture     Final   Value: GROUP B STREP(S.AGALACTIAE)ISOLATED     Note: Gram Stain Report Called to,Read Back By and Verified With: ANGELA BARLOW 09/02/13 @ 2:04PM BY RUSCOE A.     Performed at [facility]   Report Status 09/04/2013 FINAL   Final   Organism ID, Bacteria GROUP B STREP(S.AGALACTIAE)ISOLATED   Final  CULTURE, BLOOD (ROUTINE X 2)     Status: None   Collection Time    09/01/13  8:34 PM      Result Value Ref Range Status   Specimen Description BLOOD LEFT ARM   Final   Special Requests     Final   Value: BOTTLES DRAWN AEROBIC AND ANAEROBIC 10CC BLUE,5CC RED   Culture  Setup Time     Final   Value: 09/02/2013 02:18     Performed at [facility]   Culture     Final   Value: GROUP B STREP(S.AGALACTIAE)ISOLATED     Note:  SUSCEPTIBILITIES PERFORMED ON PREVIOUS CULTURE WITHIN THE LAST 5 DAYS.     Note: Gram Stain Report Called to,Read Back By and Verified With: ANGELA BARLOW 09/02/13 @ 2:04PM BY RUSCOE A.     Performed at [facility]   Report Status 09/04/2013 FINAL   Final  URINE CULTURE     Status: None   Collection Time    09/01/13  8:48 PM      Result Value Ref Range Status   Specimen Description URINE, CLEAN CATCH   Final   Special Requests NONE   Final   Culture  Setup Time     Final   Value: 09/02/2013 02:54     Performed at [facility] Count     Final   Value: 30,000 COLONIES/ML     Performed at [facility]   Culture     Final   Value: GROUP B STREP(S.AGALACTIAE)ISOLATED     Note: TESTING AGAINST S. AGALACTIAE NOT ROUTINELY PERFORMED DUE TO PREDICTABILITY OF AMP/PEN/VAN SUSCEPTIBILITY.     Performed at [facility]   Report Status 09/03/2013 FINAL   Final  MRSA PCR SCREENING     Status: None   Collection Time    09/02/13  1:46 AM      Result Value Ref Range Status   MRSA by PCR NEGATIVE  NEGATIVE Final   Comment:            The GeneXpert MRSA Assay (FDA     approved for NASAL specimens     only), is one component of a     comprehensive MRSA colonization     surveillance program. It is not     intended to diagnose MRSA     infection nor to guide or     monitor treatment for     MRSA infections.     History of present illness and  Hospital Course:     Kindly see H&P for history of present illness and admission details, please review complete Labs, Consult reports and Test reports for all details in brief Victor Morgan, is a 80 y.o. male, patient with history of CAD, sleep apnea, DM, and COPD presented with complaints of fever, chills and weakness. He has a history of sepsis from lower extremity cellulitis and this was similar to prior presentation. He had surgery on his toe 1 week ago-removal of an ingrown toenail. On initial ED evaluation no  signs of  LE cellulitis however 1 hour later left lower extremity demonstrated streaky redness and swelling, patient also spiked a temp to 103.9 and had flushing of his skin.     His workup was consistent with sepsis due to group B strep bacteremia caused by left lower the cellulitis. He was placed on Rocephin IV along with leg elevation with excellent results, he is clinically completely defervesced, afebrile, normal white count, cellulitis is much better on exam, case was discussed with infectious disease physician Dr. Orvan Falconerampbell over the phone he will be placed on oral amoxicillin for 10 more days thereafter discharged home with outpatient followup with PCP. Instructed to keep his left leg elevated if sitting in the chair.     History of sleep apnea continue CPAP at home per home settings.     History of diabetes mellitus type 2, gout, hypertension, dyslipidemia , chronic diastolic dysfunction. He will resume home medications unchanged.      Note patient has been enrolled in a pulmonary study by Dr. Marchelle Gearingamaswamy and he will be placed by pulmonary on either xaralto or a placebo per their protocol. This medication will not be prescribed by our office. Discussed with Dr. Marchelle Gearingamaswamy.     Today   Subjective:   Victor Morgan today has no headache,no chest abdominal pain,no new weakness tingling or numbness, feels much better wants to go home today.   Objective:   Blood pressure 132/69, pulse 70, temperature 97.8 F (36.6 C), temperature source Axillary, resp. rate 16, height 5\' 10"  (1.778 m), weight 99.8 kg (220 lb 0.3 oz), SpO2 98.00%.   Intake/Output Summary (Last 24 hours) at 09/07/13 1021 Last data filed at 09/07/13 1014  Gross per 24 hour  Intake      3 ml  Output    250 ml  Net   -247 ml    Exam Awake Alert, Oriented *3, No new F.N deficits, Normal affect [place].AT,PERRAL Supple Neck,No JVD, No cervical lymphadenopathy appriciated.  Symmetrical Chest wall movement, Good air  movement bilaterally, CTAB RRR,No Gallops,Rubs or new Murmurs, No Parasternal Heave +ve B.Sounds, Abd Soft, Non tender, No organomegaly appriciated, No rebound -guarding or rigidity. No Cyanosis, Clubbing or edema, No new Rash or bruise Left leg edema, erythema and warmth much improved.   Data Review   CBC w Diff: Lab Results  Component Value Date   WBC 9.4 09/07/2013   HGB 13.7 09/07/2013   HCT 41.2 09/07/2013   PLT 205 09/07/2013   LYMPHOPCT 25 09/07/2013   MONOPCT 8 09/07/2013   EOSPCT 4 09/07/2013   BASOPCT 0 09/07/2013    CMP: Lab Results  Component Value Date   NA 142 09/05/2013   K 3.9 09/05/2013   CL 101 09/05/2013   CO2 27 09/05/2013   BUN 17 09/05/2013   CREATININE 1.05 09/07/2013   CREATININE 1.00 01/16/2013   PROT 7.0 01/16/2013   ALBUMIN 4.6 01/16/2013   BILITOT 0.5 01/16/2013   ALKPHOS 58 01/16/2013   AST 20 01/16/2013   ALT 16 01/16/2013  .   Total Time in preparing paper work, data evaluation and todays exam - 35 minutes  Leroy SeaSINGH,Zuley Lutter K M.D on 09/07/2013 at 10:21 AM  [facility] Office  [phone]

## 2013-09-14 ENCOUNTER — Ambulatory Visit (INDEPENDENT_AMBULATORY_CARE_PROVIDER_SITE_OTHER): Payer: Self-pay | Admitting: Pulmonary Disease

## 2013-09-14 ENCOUNTER — Encounter: Payer: Self-pay | Admitting: Pulmonary Disease

## 2013-09-14 VITALS — BP 126/60 | HR 58 | Temp 97.1°F | Ht 69.5 in | Wt 217.8 lb

## 2013-09-14 DIAGNOSIS — R6520 Severe sepsis without septic shock: Secondary | ICD-10-CM

## 2013-09-14 DIAGNOSIS — R6521 Severe sepsis with septic shock: Principal | ICD-10-CM

## 2013-09-14 DIAGNOSIS — A419 Sepsis, unspecified organism: Secondary | ICD-10-CM

## 2013-09-14 NOTE — Assessment & Plan Note (Signed)
Cellulitis Resolved Ct xarelto per mariner study Visits planned on day 21 & 45 Answered all questions

## 2013-09-14 NOTE — Progress Notes (Signed)
   Subjective:    Patient ID: Victor Morgan, male    DOB: Aug 19, 1932, 80 y.o.   MRN: [id number]  HPI Chronic cough for 70yrs , intermittent.  Cleared in 2014  Interim - Enrolled in Mariner study after hospitalisation for LLE cellulitis   09/14/2013  The patient has no additional coughing.There is no sinus pressure. There is no dyspnea. There is no fever chills or sweats. The patient's finished current course of amox. The patient continues nasal steroid & combivent prn. Had a gout attack - took colchicine -caused loose stools x 3 ds now subsided Sugars slight high 130-150 range on metformin    Review of Systems neg for any significant sore throat, dysphagia, itching, sneezing, nasal congestion or excess/ purulent secretions, fever, chills, sweats, unintended wt loss, pleuritic or exertional cp, hempoptysis, orthopnea pnd or change in chronic leg swelling. Also denies presyncope, palpitations, heartburn, abdominal pain, nausea, vomiting, diarrhea or change in bowel or urinary habits, dysuria,hematuria, rash, arthralgias, visual complaints, headache, numbness weakness or ataxia.     Objective:   Physical Exam   Gen. Pleasant, well-nourished, in no distress ENT - no lesions, no post nasal drip Neck: No JVD, no thyromegaly, no carotid bruits Lungs: no use of accessory muscles, no dullness to percussion, clear without rales or rhonchi  Cardiovascular: Rhythm regular, heart sounds  normal, no murmurs or gallops, no peripheral edema Musculoskeletal: No deformities, no cyanosis or clubbing  LLE - decreased redness, 2+ edema, non tender       Assessment & Plan:

## 2013-09-28 ENCOUNTER — Encounter: Payer: Self-pay | Admitting: Internal Medicine

## 2013-09-28 ENCOUNTER — Ambulatory Visit (INDEPENDENT_AMBULATORY_CARE_PROVIDER_SITE_OTHER): Payer: Self-pay | Admitting: Internal Medicine

## 2013-09-28 VITALS — BP 158/80 | HR 59 | Ht 69.0 in | Wt 221.4 lb

## 2013-09-28 DIAGNOSIS — Z7901 Long term (current) use of anticoagulants: Secondary | ICD-10-CM

## 2013-09-28 DIAGNOSIS — Z299 Encounter for prophylactic measures, unspecified: Secondary | ICD-10-CM

## 2013-09-28 NOTE — Progress Notes (Signed)
   Subjective:    Patient ID: Victor Morgan, male    DOB: 12-Feb-1933, 80 y.o.   MRN: [id number]  HPI   OV 09/28/2013  Research Visit Day 21 for MARINER Study - xaretlo 10mg  v placebo for 45 days since dc from hospital to prevent dvt post dc  09/28/2013 is D 21 of study drug. Feeling well overall. Having some back pain x 10 days; recurrence of old. Took NSAID x 1 only today with relief. Compliant with study drug. No hospitlization, No death, No PE, No DVT, No bleeding. No ER/UC visits. Feels good. No dyspnea. LLE cellulitis improving with redness better but not resolved edema/reddness  Old sinus drainage + without change    Review of Systems  Constitutional: Negative for fever and unexpected weight change.  HENT: Negative for congestion, dental problem, ear pain, nosebleeds, postnasal drip, rhinorrhea, sinus pressure, sneezing, sore throat and trouble swallowing.   Eyes: Negative for redness and itching.  Respiratory: Negative for cough, chest tightness, shortness of breath and wheezing.   Cardiovascular: Negative for palpitations and leg swelling.  Gastrointestinal: Negative for nausea and vomiting.  Genitourinary: Negative for dysuria.  Musculoskeletal: Negative for joint swelling.  Skin: Negative for rash.  Neurological: Negative for headaches.  Hematological: Does not bruise/bleed easily.  Psychiatric/Behavioral: Negative for dysphoric mood. The patient is not nervous/anxious.        Objective:   Physical Exam     Gen. Pleasant, well-nourished, in no distress ENT - no lesions, no post nasal drip Neck: No JVD, no thyromegaly, no carotid bruits Lungs: no use of accessory muscles, no dullness to percussion, clear without rales or rhonchi  Cardiovascular: Rhythm regular, heart sounds  normal, no murmurs or gallops, no peripheral edema Musculoskeletal: No deformities, no cyanosis or clubbing  LLE - decreased redness, 2+ edema, non tender   Filed Vitals:   09/28/13 1229   Height: 5\' 9"  (1.753 m)  Weight: 221 lb 6.4 oz (100.426 kg)   Filed Vitals:   09/28/13 1229  BP: 158/80  Pulse: 59  Height: 5\' 9"  (1.753 m)  Weight: 221 lb 6.4 oz (100.426 kg)  SpO2: 96%        Assessment & Plan:

## 2013-09-28 NOTE — Patient Instructions (Signed)
Continue xarelto v placebo (study drug) on Mariner per protocol Can take NSAID occasionally but not regularly per protocol

## 2013-10-03 ENCOUNTER — Encounter: Payer: Self-pay | Admitting: Pulmonary Disease

## 2013-10-04 NOTE — Telephone Encounter (Signed)
Per MyChart message from patient: Message Body  AT my last visit an appointment was scheduled for April 16, at 10:00am. Has that been changed to April 13, at 1:30?   Please clarify.   Victor Morgan   Message sent back to patient informing him that his chart does not show where an appt was scheduled for the 16th, then cancelled/rescheduled to the 13th.  Apologized to patient for any confusion/inconvenience/miscommunication.  Pt also asked if we need to work at changing this appt date.

## 2013-10-08 NOTE — Telephone Encounter (Signed)
I have sent a staff message to Para March to clarify if the appt on 4-16 is for research. Carron Curie, CMA

## 2013-10-22 ENCOUNTER — Ambulatory Visit: Payer: Medicare HMO | Admitting: Internal Medicine

## 2013-10-25 ENCOUNTER — Encounter: Payer: Self-pay | Admitting: Internal Medicine

## 2013-10-25 ENCOUNTER — Ambulatory Visit: Payer: Self-pay | Admitting: Internal Medicine

## 2013-10-25 VITALS — BP 140/78 | HR 63 | Temp 98.0°F | Ht 69.5 in | Wt 227.0 lb

## 2013-10-25 DIAGNOSIS — Z299 Encounter for prophylactic measures, unspecified: Secondary | ICD-10-CM | POA: Insufficient documentation

## 2013-10-25 NOTE — Progress Notes (Signed)
   Subjective:    Patient ID: Victor Morgan, male    DOB: 1933/01/21, 80 y.o.   MRN: [id number]  HPI   OV 09/28/2013  Research Visit Day 21 for MARINER Study - xaretlo 10mg  v placebo for 45 days since dc from hospital to prevent dvt post dc  09/28/2013 is D 21 of study drug. Feeling well overall. Having some back pain x 10 days; recurrence of old. Took NSAID x 1 only today with relief. Compliant with study drug. No hospitlization, No death, No PE, No DVT, No bleeding. No ER/UC visits. Feels good. No dyspnea. LLE cellulitis improving with redness better but not resolved edema/reddness  Old sinus drainage + without change    10/25/2013 f/u ov/Aerik Polan re: day #45 of mariner last dose one day prior to OV  - does have bilateral leg edema but improved since prior ov.  No calf pain, cp, sob or bleeding on study drug and no sob  No obvious day to day or daytime variabilty or assoc chronic cough or cp or chest tightness, subjective wheeze overt sinus or hb symptoms. No unusual exp hx or h/o childhood pna/ asthma or knowledge of premature birth.  Sleeping ok without nocturnal  or early am exacerbation  of respiratory  c/o's or need for noct saba. Also denies any obvious fluctuation of symptoms with weather or environmental changes or other aggravating or alleviating factors except as outlined above   Current Medications, Allergies, Complete Past Medical History, Past Surgical History, Family History, and Social History were reviewed in [employer] record.  ROS  The following are not active complaints unless bolded sore throat, dysphagia, dental problems, itching, sneezing,  nasal congestion or excess/ purulent secretions, ear ache,   fever, chills, sweats, unintended wt loss, pleuritic or exertional cp, hemoptysis,  orthopnea pnd or leg swelling, presyncope, palpitations, heartburn, abdominal pain, anorexia, nausea, vomiting, diarrhea  or change in bowel or urinary habits, change  in stools or urine, dysuria,hematuria,  rash, arthralgias, visual complaints, headache, numbness weakness or ataxia or problems with walking or coordination,  change in mood/affect or memory.                   Objective:   Physical Exam  Wt Readings from Last 3 Encounters:  10/25/13 227 lb (102.967 kg)  09/28/13 221 lb 6.4 oz (100.426 kg)  09/14/13 217 lb 12.8 oz (98.793 kg)       Gen. Pleasant, well-nourished, in no distress ENT - no lesions, no post nasal drip Neck: No JVD, no thyromegaly, no carotid bruits Lungs: no use of accessory muscles, no dullness to percussion, clear without rales or rhonchi  Cardiovascular: Rhythm regular, heart sounds  normal, no murmurs or gallops, no peripheral edema Musculoskeletal: No deformities, no cyanosis or clubbing  Lower Ext:  Edema pitting   1+ on L, trace on R, mild erythema, no calf tenderness         Assessment & Plan:

## 2013-10-25 NOTE — Assessment & Plan Note (Signed)
Mariner study completed 10/25/2013 s complications

## 2013-10-28 ENCOUNTER — Encounter: Payer: Self-pay | Admitting: Internal Medicine

## 2015-01-06 ENCOUNTER — Other Ambulatory Visit: Payer: Self-pay

## 2015-02-05 ENCOUNTER — Encounter: Payer: Self-pay | Admitting: Cardiovascular Disease

## 2019-05-23 ENCOUNTER — Ambulatory Visit: Payer: Medicare HMO | Admitting: Podiatry

## 2019-08-06 ENCOUNTER — Ambulatory Visit: Payer: Medicare HMO | Attending: Internal Medicine

## 2019-08-06 DIAGNOSIS — Z23 Encounter for immunization: Secondary | ICD-10-CM | POA: Insufficient documentation

## 2019-08-06 NOTE — Progress Notes (Signed)
   Covid-19 Vaccination Clinic  Name:  Victor Morgan    MRN: [id number] DOB: 10/16/1932  08/06/2019  [name] was observed post Covid-19 immunization for 15 minutes without incidence. He was provided with Vaccine Information Sheet and instruction to access the V-Safe system.   [name] was instructed to call 911 with any severe reactions post vaccine: Marland Kitchen Difficulty breathing  . Swelling of your face and throat  . A fast heartbeat  . A bad rash all over your body  . Dizziness and weakness    Immunizations Administered    Name Date Dose VIS Date Route   Pfizer COVID-19 Vaccine 08/06/2019 10:40 AM 0.3 mL 06/22/2019 Intramuscular   Manufacturer: [employer], [employer]   Lot: [id number]   NDC: [address]-6

## 2019-08-27 ENCOUNTER — Ambulatory Visit: Payer: Medicare HMO | Attending: Internal Medicine

## 2019-08-27 DIAGNOSIS — Z23 Encounter for immunization: Secondary | ICD-10-CM | POA: Insufficient documentation

## 2019-08-27 NOTE — Progress Notes (Signed)
   Covid-19 Vaccination Clinic  Name:  Victor Morgan    MRN: [id number] DOB: 1932/11/16  08/27/2019  [name] was observed post Covid-19 immunization for 15 minutes without incidence. He was provided with Vaccine Information Sheet and instruction to access the V-Safe system.   Mr. Wedge was instructed to call 911 with any severe reactions post vaccine: Marland Kitchen Difficulty breathing  . Swelling of your face and throat  . A fast heartbeat  . A bad rash all over your body  . Dizziness and weakness    Immunizations Administered    Name Date Dose VIS Date Route   Pfizer COVID-19 Vaccine 08/27/2019 10:14 AM 0.3 mL 06/22/2019 Intramuscular   Manufacturer: [employer], [employer]   Lot: [id number]   NDC: [address]-5
